# Patient Record
Sex: Female | Born: 1990 | Race: White | Hispanic: No | Marital: Married | Smoking: Never smoker
Health system: Southern US, Community
[De-identification: ages and names within clinical notes are randomized; demographics above are authoritative.]

## PROBLEM LIST (undated history)

## (undated) DIAGNOSIS — K219 Gastro-esophageal reflux disease without esophagitis: Secondary | ICD-10-CM

## (undated) DIAGNOSIS — M1990 Unspecified osteoarthritis, unspecified site: Secondary | ICD-10-CM

## (undated) DIAGNOSIS — R519 Headache, unspecified: Secondary | ICD-10-CM

## (undated) HISTORY — PX: NO PAST SURGERIES: SHX2092

## (undated) HISTORY — DX: Unspecified osteoarthritis, unspecified site: M19.90

## (undated) HISTORY — DX: Headache, unspecified: R51.9

## (undated) HISTORY — DX: Gastro-esophageal reflux disease without esophagitis: K21.9

---

## 2018-01-10 ENCOUNTER — Ambulatory Visit
Admission: EM | Admit: 2018-01-10 | Discharge: 2018-01-10 | Disposition: A | Discharge status: Home / Self Care | Payer: BLUE CROSS/BLUE SHIELD | Attending: Family Medicine | Admitting: Family Medicine

## 2018-01-10 DIAGNOSIS — S76111A Strain of right quadriceps muscle, fascia and tendon, initial encounter: Secondary | ICD-10-CM | POA: Diagnosis not present

## 2018-01-10 DIAGNOSIS — Y936A Activity, physical games generally associated with school recess, summer camp and children: Secondary | ICD-10-CM | POA: Diagnosis not present

## 2018-01-10 MED ORDER — MELOXICAM 15 MG PO TABS: 15.0000 mg | ORAL_TABLET | Freq: Every day | ORAL | 0 refills | Status: DC | PRN

## 2018-01-10 NOTE — ED Triage Notes (Signed)
Pt was playing kickball on Wednesday and her right leg gave out and having pain in her right thigh that isn't getting better. Hurts when she moves it a certain way, pt is limping. Did ice it the first few days and that did decrease the knot.

## 2018-01-10 NOTE — ED Provider Notes (Signed)
[facility]    CSN: [id number] Arrival date & time: 01/10/18  0804  History   Chief Complaint Chief Complaint  Patient presents with  . Leg Pain   HPI  27 year old female presents with the above complaint.  Patient states she was playing kickball on Wednesday.  She slid and alert wants and believes that she injured her right thigh.  She also went to kick the ball subsequently had significant pain of the anterior right thigh.  Patient states that it feels weak.  She states that there is an area of swelling in the anterior thigh.  Has improved with ice.  Patient states that she is able to walk.  Pain is worse with certain activities.  No reports of knee pain.  Reports of hip pain.  No other associated symptoms.  No other complaints.  History reviewed as below.  PMH: No significant PMH.  Surgical Hx: None.  OB History   None    Social History Social History   Tobacco Use  . Smoking status: Never Smoker  . Smokeless tobacco: Never Used  Substance Use Topics  . Alcohol use: Yes  . Drug use: Never     Allergies   Patient has no known allergies.   Review of Systems Review of Systems  Constitutional: Negative.   Musculoskeletal:       Right thigh pain.   Physical Exam Triage Vital Signs ED Triage Vitals  Enc Vitals Group     BP 01/10/18 0817 (!) 141/80     Pulse Rate 01/10/18 0817 64     Resp 01/10/18 0817 18     Temp 01/10/18 0817 98.1 F (36.7 C)     Temp Source 01/10/18 0817 Oral     SpO2 01/10/18 0817 100 %     Weight 01/10/18 0820 219 lb (99.3 kg)     Height --      Head Circumference --      Peak Flow --      Pain Score 01/10/18 0820 5     Pain Loc --      Pain Edu? --      Excl. in GC? --    Updated Vital Signs BP (!) 141/80 (BP Location: Right Arm)   Pulse 64   Temp 98.1 F (36.7 C) (Oral)   Resp 18   Wt 99.3 kg   LMP 01/04/2018 (Exact Date)   SpO2 100%   Visual Acuity Right Eye Distance:   Left Eye Distance:   Bilateral  Distance:    Right Eye Near:   Left Eye Near:    Bilateral Near:     Physical Exam  Constitutional: She is oriented to person, place, and time. She appears well-developed. No distress.  HENT:  Head: Normocephalic and atraumatic.  Eyes: Conjunctivae are normal. Right eye exhibits no discharge. Left eye exhibits no discharge.  Pulmonary/Chest: Effort normal. No respiratory distress.  Musculoskeletal:  Right anterior thigh with mild tenderness. Slightly decreased strength of the quadriceps.  Neurological: She is alert and oriented to person, place, and time.  Psychiatric: She has a normal mood and affect. Her behavior is normal.  Nursing note and vitals reviewed.  UC Treatments / Results  Labs (all labs ordered are listed, but only abnormal results are displayed) Labs Reviewed - No data to display  EKG None  Radiology No results found.  Procedures Procedures (including critical care time)  Medications Ordered in UC Medications - No data to display  Initial Impression /  Assessment and Plan / UC Course  I have reviewed the triage vital signs and the nursing notes.  Pertinent labs & imaging results that were available during my care of the patient were reviewed by me and considered in my medical decision making (see chart for details).    27 year old female presents with strain of the quadriceps.  Advised rest, ice.  Meloxicam as needed.  If fails to improve or worsens, she should see my colleague Dr. Katrinka Blazing (Sports medicine); his information was given.  Final Clinical Impressions(s) / UC Diagnoses   Final diagnoses:  Strain of right quadriceps, initial encounter     Discharge Instructions     Rest, ice, elevation.  Use the medication.  If you are not improving in 1 week, contact Dr. Katrinka Blazing.  Take care  Dr. Adriana Simas    ED Prescriptions    Medication Sig Dispense Auth. Provider   meloxicam (MOBIC) 15 MG tablet Take 1 tablet (15 mg total) by mouth daily as needed.  30 tablet Tommie Sams, DO     Controlled Substance Prescriptions [place] Controlled Substance Registry consulted? Not Applicable   Tommie Sams, [place] 01/10/18 [phone]

## 2018-01-10 NOTE — Discharge Instructions (Signed)
Rest, ice, elevation.  Use the medication.  If you are not improving in 1 week, contact Dr. Katrinka BlazingSmith.  Take care  Dr. Adriana Simasook

## 2019-04-30 NOTE — L&D Delivery Note (Signed)
  Kellie Ross, Kellie Ross [[id number]]  Delivery Note Pt pushed for about an hour and at 2:41 PM a viable female was delivered via Vaginal, Spontaneous (Presentation: Left Occiput Anterior).  APGAR: 9, 10; weight 5 lb 5.4 oz (2420 g).   Loose nuchal x 1 was reduced over infants head. Anterior and posterior shoulders delivered easily next. After a minute the cord was clamped and cut by FOB and infant handed off to the NICU team.   The placenta was left in place but identified with two clamps.       Brielle, Moro [[id number]]  Delivery Note  Next, a bedside ultrasound comfirmed baby B still in vertex position; high station. Under US guidance and with mild fundal pressure applied, AROM was effected with copious clear fluid noted.  Pt pushed for 3-4 sets and at 2:47 PM a viable female was delivered via Vaginal, Spontaneous (Presentation: Right Occiput Anterior).  APGAR: 8, 9; weight 5 lb 11.7 oz (2600 g).   The cord was loosely wrapped around infants arm otherwise no complications.  Cord was similarly clamped and cur after a minute delay by FOB. Infant was handed to the second NICU team standing by. Cord blood was obtained x 2 next. Both placentas were then spontaneously delivered in [place], marked and sent to L/D.  They were both intact and had Cords: 3 vessels with the following complications: None.  Cord pH: n/a  While the second degree perineal laceration was being repaired, a steady stream of bleeding was noted. Fundus was firm except for lower lower uterine segment.  Patients lips were noted to be pale and she reported feeling weak.  Stat cbc was ordered, fluids and pitocin run wide open. A second IV line was placed and albumen administered.  When bleeding continued, tranexamic acid was given.  Once bleeding was estimated to exceed 1023ml, a code hemorrhage was called.  Pt met criteria for a stage 2- orders were implemented.  Fundal height was noted at 6-8GS above umbilicusand to the  patients right.  The bladder was drained of urine.  Bleeding subsequently slowed to a stop and BP which had been cycling q 82mins was noted to stabilized around 120s/70-80s.   Continued to monitor patient for a little longer.  Cbc showed EBL dropped from 12.3 - 7.6  Routine pp procedure then implemented.  Anesthesia: Epidural Episiotomy: None Lacerations: 2nd degree Suture Repair: 2.0 3.0 vicryl Est. Blood Loss (mL): 1982  Mom to postpartum.  Baby to NICU.  Isaiah Serge 01/04/2020, 8:05 PM

## 2019-07-20 LAB — OB RESULTS CONSOLE HIV ANTIBODY (ROUTINE TESTING): HIV: NONREACTIVE

## 2019-07-20 LAB — OB RESULTS CONSOLE RPR: RPR: NONREACTIVE

## 2019-07-20 LAB — OB RESULTS CONSOLE HEPATITIS B SURFACE ANTIGEN: Hepatitis B Surface Ag: NEGATIVE

## 2019-07-20 LAB — OB RESULTS CONSOLE RUBELLA ANTIBODY, IGM: Rubella: IMMUNE

## 2019-09-28 ENCOUNTER — Other Ambulatory Visit: Payer: Self-pay | Admitting: Obstetrics and Gynecology

## 2019-09-28 DIAGNOSIS — O30091 Twin pregnancy, unable to determine number of placenta and number of amniotic sacs, first trimester: Secondary | ICD-10-CM

## 2019-10-13 ENCOUNTER — Encounter: Payer: Self-pay | Admitting: *Deleted

## 2019-10-13 ENCOUNTER — Other Ambulatory Visit: Payer: Self-pay | Admitting: Obstetrics and Gynecology

## 2019-10-13 DIAGNOSIS — O30091 Twin pregnancy, unable to determine number of placenta and number of amniotic sacs, first trimester: Secondary | ICD-10-CM

## 2019-10-15 ENCOUNTER — Ambulatory Visit: Payer: BC Managed Care – PPO | Admitting: *Deleted

## 2019-10-15 ENCOUNTER — Ambulatory Visit: Payer: BC Managed Care – PPO | Attending: Obstetrics and Gynecology

## 2019-10-15 ENCOUNTER — Other Ambulatory Visit: Payer: Self-pay

## 2019-10-15 ENCOUNTER — Ambulatory Visit (HOSPITAL_BASED_OUTPATIENT_CLINIC_OR_DEPARTMENT_OTHER): Payer: BC Managed Care – PPO | Admitting: Obstetrics and Gynecology

## 2019-10-15 VITALS — BP 134/71 | HR 113 | Ht 70.5 in

## 2019-10-15 DIAGNOSIS — O99212 Obesity complicating pregnancy, second trimester: Secondary | ICD-10-CM | POA: Diagnosis present

## 2019-10-15 DIAGNOSIS — E668 Other obesity: Secondary | ICD-10-CM

## 2019-10-15 DIAGNOSIS — O0990 Supervision of high risk pregnancy, unspecified, unspecified trimester: Secondary | ICD-10-CM | POA: Insufficient documentation

## 2019-10-15 DIAGNOSIS — Z363 Encounter for antenatal screening for malformations: Secondary | ICD-10-CM

## 2019-10-15 DIAGNOSIS — O30042 Twin pregnancy, dichorionic/diamniotic, second trimester: Secondary | ICD-10-CM

## 2019-10-15 DIAGNOSIS — E669 Obesity, unspecified: Secondary | ICD-10-CM | POA: Diagnosis not present

## 2019-10-15 DIAGNOSIS — O30091 Twin pregnancy, unable to determine number of placenta and number of amniotic sacs, first trimester: Secondary | ICD-10-CM | POA: Diagnosis not present

## 2019-10-15 DIAGNOSIS — Z3A23 23 weeks gestation of pregnancy: Secondary | ICD-10-CM

## 2019-10-15 NOTE — Progress Notes (Signed)
Maternal-Fetal Medicine  Name: Kellie Ross MRN: [id number] Referring Provider: Dr. Sherron Monday  Ms. [place], G1 P0 at 23w 2d gestation with dichorionic-diamniotic twin pregnancy, is here for fetal anatomy scan and consultation.  Topical survey was inadequate at your office.  Patient had opted not to screen for fetal aneuploidies. This is a natural conception. Past medical history: No history of diabetes or hypertension or any chronic medical conditions. Past surgical history: Nil of note. Medications: Prenatal vitamins. Allergies: No known drug allergies. Social history: Denies tobacco, drug or alcohol use.  She has been married for years and her husband is in good health. Family history: Father has hypertension and mother has diabetes.  Sister had cervical incompetence and a mid-trimester pregnancy loss.  No history of venous thromboembolism in the family. On ultrasound performed at your office, the cervix measured 3.7 cm.  On today's ultrasound, we confirmed dichorionic-diamniotic twin pregnancy. Twin A: Lower fetus, cephalic presentation, posterior placenta, female fetus.  The estimated fetal weight is at the 96th percentile.  Amniotic fluid is normal and good fetal activity seen.  No markers of aneuploidies or fetal structural defects are seen.  Fetal anatomical survey was incomplete because of maternal body habitus and poor resolution of images.  Twin B: Upper fetus, transverse lie and head to maternal left, anterior placenta, female fetus. The estimated fetal weight is at the 97th percentile.  Amniotic fluid is normal and good fetal activity seen.  No markers of aneuploidies or fetal structural defects are seen.  Fetal anatomical survey was incomplete because of maternal body habitus and poor resolution of images. Growth discordancy: 2% (normal). Patient is 5 feet 11 inches tall and her husband is 6 tall.  Our concerns include: Twin pregnancy:  I explained the significance of  chorionicity and its implications. Possible complications associated with twin pregnancy include preterm labor/delivery (most common), fetal growth restriction of one or both twins, miscarriage, malpresentations and increased cesarean delivery rate, postpartum hemorrhage. Maternal complications including gestational diabetes and gestational hypertension/preeclampsia are more common. I discussed the mode of delivery that is based on the presentations.  If both have Vx/Vx or Vx/non-vertex presentations, vaginal delivery may be considered. In Vx/non-vx, vaginal delivery followed by internal podalic version of second twin will achieve vaginal delivery. In non-vx first twin presentation, cesarean delivery will be performed.  I emphasized the importance of weight gain (24 lbs by 24 weeks) to improve fetal weight and decrease the chances of preterm delivery.  Low-dose aspirin is beneficial in preventing preeclampsia. Based on systematic reviews, the [place] Preventive Services Task Force (USPSTF) recommended low-dose aspirin in pregnancies that are at high-risk of developing preeclampsia [facility] [phone]). Twin pregnancy is at high risk for preeclampsia. I recommended aspirin (81 mg daily) to be started from now until delivery. She does not have contraindications to aspirin.   -An appointment was made for her to return in 4 weeks for completion of fetal anatomy if feasible. -Follow-up fetal growth assessment may be performed at your office. -Serial fetal growth assessments every 4 weeks. -Weekly antenatal testing at 36 and 37 weeks. -Delivery at 38 weeks.  -Aspirin 81 mg daily until delivery.  Thank you for consultation. Please contact me at the [facility] if you have any questions. Consultation including face-to-face counseling: 45 minutes.

## 2019-10-20 ENCOUNTER — Other Ambulatory Visit: Payer: Self-pay | Admitting: *Deleted

## 2019-10-20 DIAGNOSIS — O30049 Twin pregnancy, dichorionic/diamniotic, unspecified trimester: Secondary | ICD-10-CM

## 2019-11-15 ENCOUNTER — Other Ambulatory Visit: Payer: Self-pay

## 2019-11-15 ENCOUNTER — Encounter (INDEPENDENT_AMBULATORY_CARE_PROVIDER_SITE_OTHER): Payer: Self-pay

## 2019-11-15 ENCOUNTER — Ambulatory Visit: Payer: Self-pay | Attending: Obstetrics and Gynecology

## 2019-11-15 ENCOUNTER — Other Ambulatory Visit: Payer: Self-pay | Admitting: *Deleted

## 2019-11-15 ENCOUNTER — Ambulatory Visit: Payer: Self-pay | Admitting: *Deleted

## 2019-11-15 VITALS — BP 142/82 | HR 108

## 2019-11-15 DIAGNOSIS — O30042 Twin pregnancy, dichorionic/diamniotic, second trimester: Secondary | ICD-10-CM

## 2019-11-15 DIAGNOSIS — E669 Obesity, unspecified: Secondary | ICD-10-CM

## 2019-11-15 DIAGNOSIS — O99212 Obesity complicating pregnancy, second trimester: Secondary | ICD-10-CM

## 2019-11-15 DIAGNOSIS — O0990 Supervision of high risk pregnancy, unspecified, unspecified trimester: Secondary | ICD-10-CM

## 2019-11-15 DIAGNOSIS — Z362 Encounter for other antenatal screening follow-up: Secondary | ICD-10-CM

## 2019-11-15 DIAGNOSIS — O30043 Twin pregnancy, dichorionic/diamniotic, third trimester: Secondary | ICD-10-CM

## 2019-11-15 DIAGNOSIS — Z3A27 27 weeks gestation of pregnancy: Secondary | ICD-10-CM

## 2019-11-15 DIAGNOSIS — O9A212 Injury, poisoning and certain other consequences of external causes complicating pregnancy, second trimester: Secondary | ICD-10-CM

## 2019-11-15 DIAGNOSIS — T1490XA Injury, unspecified, initial encounter: Secondary | ICD-10-CM

## 2019-11-15 DIAGNOSIS — O30049 Twin pregnancy, dichorionic/diamniotic, unspecified trimester: Secondary | ICD-10-CM | POA: Insufficient documentation

## 2019-12-13 ENCOUNTER — Encounter: Payer: Self-pay | Admitting: *Deleted

## 2019-12-13 ENCOUNTER — Ambulatory Visit: Payer: BC Managed Care – PPO | Attending: Obstetrics and Gynecology

## 2019-12-13 ENCOUNTER — Other Ambulatory Visit: Payer: Self-pay | Admitting: *Deleted

## 2019-12-13 ENCOUNTER — Other Ambulatory Visit: Payer: Self-pay

## 2019-12-13 ENCOUNTER — Ambulatory Visit: Payer: BC Managed Care – PPO | Admitting: *Deleted

## 2019-12-13 VITALS — BP 142/93 | HR 119

## 2019-12-13 DIAGNOSIS — O30043 Twin pregnancy, dichorionic/diamniotic, third trimester: Secondary | ICD-10-CM

## 2019-12-13 DIAGNOSIS — O9A213 Injury, poisoning and certain other consequences of external causes complicating pregnancy, third trimester: Secondary | ICD-10-CM | POA: Diagnosis not present

## 2019-12-13 DIAGNOSIS — Z362 Encounter for other antenatal screening follow-up: Secondary | ICD-10-CM | POA: Diagnosis not present

## 2019-12-13 DIAGNOSIS — T1490XA Injury, unspecified, initial encounter: Secondary | ICD-10-CM | POA: Diagnosis not present

## 2019-12-13 DIAGNOSIS — O99213 Obesity complicating pregnancy, third trimester: Secondary | ICD-10-CM

## 2019-12-13 DIAGNOSIS — O30049 Twin pregnancy, dichorionic/diamniotic, unspecified trimester: Secondary | ICD-10-CM

## 2019-12-13 DIAGNOSIS — E669 Obesity, unspecified: Secondary | ICD-10-CM

## 2019-12-13 DIAGNOSIS — Z3A31 31 weeks gestation of pregnancy: Secondary | ICD-10-CM

## 2019-12-29 ENCOUNTER — Encounter (HOSPITAL_COMMUNITY): Payer: Self-pay | Admitting: Obstetrics and Gynecology

## 2019-12-29 ENCOUNTER — Other Ambulatory Visit: Payer: Self-pay

## 2019-12-29 ENCOUNTER — Inpatient Hospital Stay (HOSPITAL_COMMUNITY)
Admission: AD | Admit: 2019-12-29 | Discharge: 2020-01-07 | DRG: 806 | Disposition: A | Discharge status: Home / Self Care | Payer: BC Managed Care – PPO | Attending: Obstetrics and Gynecology | Admitting: Obstetrics and Gynecology

## 2019-12-29 ENCOUNTER — Inpatient Hospital Stay (HOSPITAL_BASED_OUTPATIENT_CLINIC_OR_DEPARTMENT_OTHER): Payer: BC Managed Care – PPO

## 2019-12-29 DIAGNOSIS — D62 Acute posthemorrhagic anemia: Secondary | ICD-10-CM | POA: Diagnosis not present

## 2019-12-29 DIAGNOSIS — Z3A34 34 weeks gestation of pregnancy: Secondary | ICD-10-CM

## 2019-12-29 DIAGNOSIS — O9962 Diseases of the digestive system complicating childbirth: Secondary | ICD-10-CM | POA: Diagnosis present

## 2019-12-29 DIAGNOSIS — O99892 Other specified diseases and conditions complicating childbirth: Secondary | ICD-10-CM | POA: Diagnosis present

## 2019-12-29 DIAGNOSIS — O1493 Unspecified pre-eclampsia, third trimester: Secondary | ICD-10-CM | POA: Diagnosis present

## 2019-12-29 DIAGNOSIS — O1413 Severe pre-eclampsia, third trimester: Secondary | ICD-10-CM

## 2019-12-29 DIAGNOSIS — Z3689 Encounter for other specified antenatal screening: Secondary | ICD-10-CM

## 2019-12-29 DIAGNOSIS — N649 Disorder of breast, unspecified: Secondary | ICD-10-CM | POA: Diagnosis present

## 2019-12-29 DIAGNOSIS — K219 Gastro-esophageal reflux disease without esophagitis: Secondary | ICD-10-CM | POA: Diagnosis present

## 2019-12-29 DIAGNOSIS — O1414 Severe pre-eclampsia complicating childbirth: Secondary | ICD-10-CM | POA: Diagnosis present

## 2019-12-29 DIAGNOSIS — O139 Gestational [pregnancy-induced] hypertension without significant proteinuria, unspecified trimester: Secondary | ICD-10-CM | POA: Diagnosis not present

## 2019-12-29 DIAGNOSIS — O30043 Twin pregnancy, dichorionic/diamniotic, third trimester: Secondary | ICD-10-CM

## 2019-12-29 DIAGNOSIS — O1494 Unspecified pre-eclampsia, complicating childbirth: Secondary | ICD-10-CM | POA: Diagnosis present

## 2019-12-29 DIAGNOSIS — O30049 Twin pregnancy, dichorionic/diamniotic, unspecified trimester: Secondary | ICD-10-CM | POA: Diagnosis present

## 2019-12-29 DIAGNOSIS — O9081 Anemia of the puerperium: Secondary | ICD-10-CM | POA: Diagnosis not present

## 2019-12-29 DIAGNOSIS — O30009 Twin pregnancy, unspecified number of placenta and unspecified number of amniotic sacs, unspecified trimester: Secondary | ICD-10-CM

## 2019-12-29 DIAGNOSIS — O1205 Gestational edema, complicating the puerperium: Secondary | ICD-10-CM | POA: Diagnosis not present

## 2019-12-29 DIAGNOSIS — Z20822 Contact with and (suspected) exposure to covid-19: Secondary | ICD-10-CM | POA: Diagnosis present

## 2019-12-29 LAB — CBC
HCT: 38.9 % (ref 36.0–46.0)
Hemoglobin: 12.8 g/dL (ref 12.0–15.0)
MCH: 30.2 pg (ref 26.0–34.0)
MCHC: 32.9 g/dL (ref 30.0–36.0)
MCV: 91.7 fL (ref 80.0–100.0)
Platelets: 212 10*3/uL (ref 150–400)
RBC: 4.24 MIL/uL (ref 3.87–5.11)
RDW: 18.6 % — ABNORMAL HIGH (ref 11.5–15.5)
WBC: 9.5 10*3/uL (ref 4.0–10.5)
nRBC: 0 % (ref 0.0–0.2)

## 2019-12-29 LAB — COMPREHENSIVE METABOLIC PANEL
ALT: 16 U/L (ref 0–44)
AST: 32 U/L (ref 15–41)
Albumin: 2.4 g/dL — ABNORMAL LOW (ref 3.5–5.0)
Alkaline Phosphatase: 194 U/L — ABNORMAL HIGH (ref 38–126)
Anion gap: 10 (ref 5–15)
BUN: 11 mg/dL (ref 6–20)
CO2: 23 mmol/L (ref 22–32)
Calcium: 8.9 mg/dL (ref 8.9–10.3)
Chloride: 104 mmol/L (ref 98–111)
Creatinine, Ser: 0.95 mg/dL (ref 0.44–1.00)
GFR calc Af Amer: >60 mL/min (ref >60)
GFR calc non Af Amer: >60 mL/min (ref >60)
Glucose, Bld: 96 mg/dL (ref 70–99)
Potassium: 4 mmol/L (ref 3.5–5.1)
Sodium: 137 mmol/L (ref 135–145)
Total Bilirubin: 0.5 mg/dL (ref 0.3–1.2)
Total Protein: 5.7 g/dL — ABNORMAL LOW (ref 6.5–8.1)

## 2019-12-29 LAB — TYPE AND SCREEN
ABO/RH(D): B POS
Antibody Screen: NEGATIVE

## 2019-12-29 LAB — PROTEIN / CREATININE RATIO, URINE
Creatinine, Urine: 481.93 mg/dL
Protein Creatinine Ratio: 1.91 mg/mg{Cre} — ABNORMAL HIGH (ref 0.00–0.15)
Total Protein, Urine: 920 mg/dL

## 2019-12-29 LAB — SARS CORONAVIRUS 2 BY RT PCR (HOSPITAL ORDER, PERFORMED IN ~~LOC~~ HOSPITAL LAB): SARS Coronavirus 2: NEGATIVE

## 2019-12-29 LAB — GROUP B STREP BY PCR: Group B strep by PCR: NEGATIVE

## 2019-12-29 MED ORDER — LABETALOL HCL 5 MG/ML IV SOLN: 40.0000 mg | INTRAVENOUS | Status: DC | PRN

## 2019-12-29 MED ORDER — HYDRALAZINE HCL 20 MG/ML IJ SOLN: 10.0000 mg | INTRAMUSCULAR | Status: DC | PRN

## 2019-12-29 MED ORDER — LABETALOL HCL 5 MG/ML IV SOLN
20.0000 mg | INTRAVENOUS | Status: DC | PRN
  Administered 2020-01-03: 20 mg via INTRAVENOUS
  Filled 2019-12-29: qty 4

## 2019-12-29 MED ORDER — LABETALOL HCL 5 MG/ML IV SOLN: 80.0000 mg | INTRAVENOUS | Status: DC | PRN

## 2019-12-29 MED ORDER — ZOLPIDEM TARTRATE 5 MG PO TABS: 5.0000 mg | ORAL_TABLET | Freq: Every evening | ORAL | Status: DC | PRN

## 2019-12-29 MED ORDER — BETAMETHASONE SOD PHOS & ACET 6 (3-3) MG/ML IJ SUSP
12.0000 mg | INTRAMUSCULAR | Status: AC
  Administered 2019-12-29 – 2019-12-30 (×2): 12 mg via INTRAMUSCULAR
  Filled 2019-12-29: qty 5

## 2019-12-29 MED ORDER — LABETALOL HCL 5 MG/ML IV SOLN
20.0000 mg | INTRAVENOUS | Status: DC | PRN
  Administered 2019-12-29: 20 mg via INTRAVENOUS
  Filled 2019-12-29: qty 4

## 2019-12-29 MED ORDER — ACETAMINOPHEN 325 MG PO TABS
650.0000 mg | ORAL_TABLET | ORAL | Status: DC | PRN
  Administered 2019-12-30 (×2): 650 mg via ORAL
  Filled 2019-12-29 (×2): qty 2

## 2019-12-29 MED ORDER — FAMOTIDINE 20 MG PO TABS
20.0000 mg | ORAL_TABLET | Freq: Every day | ORAL | Status: DC
  Administered 2019-12-29 – 2020-01-03 (×6): 20 mg via ORAL
  Filled 2019-12-29 (×8): qty 1

## 2019-12-29 MED ORDER — POLYSACCHARIDE IRON COMPLEX 150 MG PO CAPS
150.0000 mg | ORAL_CAPSULE | Freq: Every day | ORAL | Status: DC
  Administered 2019-12-30 – 2020-01-03 (×5): 150 mg via ORAL
  Filled 2019-12-29 (×5): qty 1

## 2019-12-29 MED ORDER — LABETALOL HCL 200 MG PO TABS
200.0000 mg | ORAL_TABLET | Freq: Two times a day (BID) | ORAL | Status: DC
  Administered 2019-12-29 – 2020-01-02 (×8): 200 mg via ORAL
  Filled 2019-12-29 (×8): qty 1

## 2019-12-29 MED ORDER — DOCUSATE SODIUM 100 MG PO CAPS
100.0000 mg | ORAL_CAPSULE | Freq: Every day | ORAL | Status: DC
  Administered 2019-12-30 – 2020-01-03 (×5): 100 mg via ORAL
  Filled 2019-12-29 (×5): qty 1

## 2019-12-29 MED ORDER — SODIUM CHLORIDE 0.9% FLUSH
3.0000 mL | Freq: Two times a day (BID) | INTRAVENOUS | Status: DC
  Administered 2019-12-30 – 2020-01-03 (×6): 3 mL via INTRAVENOUS

## 2019-12-29 MED ORDER — PRENATAL MULTIVITAMIN CH
1.0000 | ORAL_TABLET | Freq: Every day | ORAL | Status: DC
  Administered 2019-12-30 – 2020-01-03 (×5): 1 via ORAL
  Filled 2019-12-29 (×5): qty 1

## 2019-12-29 MED ORDER — CALCIUM CARBONATE ANTACID 500 MG PO CHEW: 2.0000 | CHEWABLE_TABLET | ORAL | Status: DC | PRN

## 2019-12-29 MED ORDER — SODIUM CHLORIDE 0.9 % IV SOLN: 250.0000 mL | INTRAVENOUS | Status: DC | PRN

## 2019-12-29 MED ORDER — SODIUM CHLORIDE 0.9% FLUSH: 3.0000 mL | INTRAVENOUS | Status: DC | PRN

## 2019-12-29 NOTE — MAU Note (Signed)
Sent from MD office for BP evaluation.  Denies visual disturbances, H/A, and epigastric pain.  Reports +FM x2. Denies LOF or VB.

## 2019-12-29 NOTE — H&P (Signed)
Kellie Ross is a 29 y.o. female, G1P0, EGA [redacted]W[redacted]D with di/di twins presenting for BP eval.  She was seen for routine OB visit today, not feeling very well, BP in the office 140-150/100, BP 2 weeks ago was 122/76.  On eval in MAU, BP up to 160/100 requiring IV Labetalol, labs normal except for UPC 1.91.  Prenatal care complicated by di/di twins with appropriate growth, anemia treated with iron, GERD treated with Pepcid.  OB History    Gravida  1   Para      Term      Preterm      AB      Living  0     SAB      TAB      Ectopic      Multiple      Live Births             Past Medical History:  Diagnosis Date  . Arthritis   . GERD (gastroesophageal reflux disease)   . Headache    Past Surgical History:  Procedure Laterality Date  . NO PAST SURGERIES     Family History: family history is not on file. Social History:  reports that she has never smoked. She has never used smokeless tobacco. She reports previous alcohol use. She reports that she does not use drugs.     Maternal Diabetes: No Genetic Screening: Declined Maternal Ultrasounds/Referrals: Normal Fetal Ultrasounds or other Referrals:  None Maternal Substance Abuse:  No Significant Maternal Medications:  None Significant Maternal Lab Results:  None Other Comments:  preeclampsia at 34 weeks, di/di twins  Review of Systems  Respiratory: Negative.   Cardiovascular: Negative.    Maternal Medical History:  Fetal activity: Perceived fetal activity is normal.    Prenatal Complications - Diabetes: none.      Blood pressure 130/82, pulse 90, temperature 98 F (36.7 C), temperature source Oral, resp. rate 18, height 5\' 11"  (1.803 m), weight 129.3 kg, SpO2 99 %. Maternal Exam:  Abdomen: Patient reports no abdominal tenderness. Introitus: Amniotic fluid character: not assessed.     Fetal Exam Fetal Monitor Review: Mode: ultrasound.   Variability: moderate (6-25 bpm).   Pattern: accelerations present  and no decelerations.    Fetal State Assessment: Category I - tracings are normal.     Physical Exam Vitals reviewed.  Constitutional:      Appearance: Normal appearance.  Cardiovascular:     Rate and Rhythm: Normal rate and regular rhythm.  Pulmonary:     Effort: Pulmonary effort is normal. No respiratory distress.  Neurological:     Mental Status: She is alert.     Prenatal labs: ABO, Rh: --/--/B POS (09/01 1424) Antibody: NEG (09/01 1424) Rubella:  Immune RPR:   NR HBsAg:   neg HIV:   NR GBS:   none  Assessment/Plan: IUP at [redacted]W[redacted]D, di/di twins that are vtx/vtx by u/s today with nl AFV, with preeclampsia.  Had some severe range BP requiring IV Labetalol, started on Labetalol 200 mg po bid, BP now 130/90.  I have discussed that if BP goes up persistently or labs become abnormal will need to move towards delivery and start magnesium for seizure prophylaxis.  Getting betamethasone for fetal pulmonary maturation.  Will admit, monitor BP closely, recheck labs in am, low threshold for starting magnesium and moving towards delivery.   09-25-1971 Kellie Ross 12/29/2019, 5:34 PM

## 2019-12-29 NOTE — MAU Provider Note (Signed)
Chief Complaint:  Hypertension   None    HPI: Kellie Ross is a 29 y.o. G1P0 at [redacted]w[redacted]d with di/di twins who presents to maternity admissions reporting hypertension. Was seen in the office today & had elevated blood pressures; sent here for further evaluation. States a few weeks ago she had high BP at her MFM appointment but otherwise has no history of hypertension.  Denies headache, visual disturbance, or epigastric pain. Denies contractions, LOF, or vaginal bleeding. Good fetal movement x 2.   Pregnancy Course: [facility] ob/gyn. Di/di twins  Past Medical History:  Diagnosis Date  . Arthritis   . GERD (gastroesophageal reflux disease)   . Headache    OB History  Gravida Para Term Preterm AB Living  1         0  SAB TAB Ectopic Multiple Live Births               # Outcome Date GA Lbr Len/2nd Weight Sex Delivery Anes PTL Lv  1 Current            Past Surgical History:  Procedure Laterality Date  . NO PAST SURGERIES     History reviewed. No pertinent family history. Social History   Tobacco Use  . Smoking status: Never Smoker  . Smokeless tobacco: Never Used  Vaping Use  . Vaping Use: Never used  Substance Use Topics  . Alcohol use: Not Currently  . Drug use: Never   No Known Allergies Medications Prior to Admission  Medication Sig Dispense Refill Last Dose  . Prenatal Vit-Fe Fumarate-FA (PRENATAL VITAMIN PO) Take by mouth.   Past Month at Unknown time  . FEROSUL 325 (65 Fe) MG tablet Take 325 mg by mouth 2 (two) times daily.     . STOOL SOFTENER 100 MG capsule Take 100 mg by mouth 2 (two) times daily.     . TRI-SPRINTEC 0.18/0.215/0.25 MG-35 MCG tablet TK 1 T PO QD (Patient not taking: Reported on 10/15/2019)  10     I have reviewed patient's Past Medical Hx, Surgical Hx, Family Hx, Social Hx, medications and allergies.   ROS:  Review of Systems  Constitutional: Negative.   Cardiovascular: Positive for leg swelling.  Gastrointestinal: Negative.   Genitourinary:  Negative.     Physical Exam   Patient Vitals for the past 24 hrs:  BP Temp Pulse Resp SpO2 Height Weight  12/29/19 1445 (!) 144/84 -- 82 -- -- -- --  12/29/19 1439 134/81 -- 85 -- -- -- --  12/29/19 1400 (!) 166/98 -- 95 -- -- -- --  12/29/19 1345 (!) 152/99 -- 94 -- -- -- --  12/29/19 1330 (!) 153/98 -- 91 -- -- -- --  12/29/19 1315 (!) 154/91 -- 84 -- -- -- --  12/29/19 1300 (!) 152/99 -- 87 -- -- -- --  12/29/19 1245 (!) 147/104 -- 98 -- -- -- --  12/29/19 1230 (!) 167/101 -- 85 -- 99 % -- --  12/29/19 1215 (!) 153/103 -- 91 -- 100 % -- --  12/29/19 1200 (!) 146/103 -- 91 -- 98 % -- --  12/29/19 1145 (!) 154/99 -- 92 -- 98 % -- --  12/29/19 1130 (!) 154/106 -- 96 -- 98 % -- --  12/29/19 1115 (!) 146/105 -- (!) 101 -- 98 % -- --  12/29/19 1102 (!) 156/98 -- -- -- -- -- --  12/29/19 1059 -- 98 F (36.7 C) (!) 101 18 97 % -- --  12/29/19 1045 -- -- -- -- --  5\' 11"  (1.803 m) 129.7 kg    Constitutional: Well-developed, well-nourished female in no acute distress.  Cardiovascular: normal rate & rhythm, no murmur Respiratory: normal effort, lung sounds clear throughout GI: Abd soft, non-tender, gravid appropriate for gestational age. Pos BS x 4 MS: Extremities nontender, 3+ pitting edema BLE, normal ROM Neurologic: Alert and oriented x 4. DTR 2 +, no clonus  Fetal Tracing: Baby A Baseline: 155 Variability: moderate Accelerations: 15x15 Decelerations: none  Baby B Baseline: 150 Variability: moderate Accelerations:15x15 Decelerations: none  Toco: irregular ctx       Labs: Results for orders placed or performed during the hospital encounter of 12/29/19 (from the past 24 hour(s))  CBC     Status: Abnormal   Collection Time: 12/29/19 11:03 AM  Result Value Ref Range   WBC 9.5 4.0 - 10.5 K/uL   RBC 4.24 3.87 - 5.11 MIL/uL   Hemoglobin 12.8 12.0 - 15.0 g/dL   HCT 02/28/20 36 - 46 %   MCV 91.7 80.0 - 100.0 fL   MCH 30.2 26.0 - 34.0 pg   MCHC 32.9 30.0 - 36.0 g/dL   RDW  43.1 (H) 54.0 - 15.5 %   Platelets 212 150 - 400 K/uL   nRBC 0.0 0.0 - 0.2 %  Comprehensive metabolic panel     Status: Abnormal   Collection Time: 12/29/19 11:03 AM  Result Value Ref Range   Sodium 137 135 - 145 mmol/L   Potassium 4.0 3.5 - 5.1 mmol/L   Chloride 104 98 - 111 mmol/L   CO2 23 22 - 32 mmol/L   Glucose, Bld 96 70 - 99 mg/dL   BUN 11 6 - 20 mg/dL   Creatinine, Ser 02/28/20 0.44 - 1.00 mg/dL   Calcium 8.9 8.9 - 7.61 mg/dL   Total Protein 5.7 (L) 6.5 - 8.1 g/dL   Albumin 2.4 (L) 3.5 - 5.0 g/dL   AST 32 15 - 41 U/L   ALT 16 0 - 44 U/L   Alkaline Phosphatase 194 (H) 38 - 126 U/L   Total Bilirubin 0.5 0.3 - 1.2 mg/dL   GFR calc non Af Amer >60 >60 mL/min   GFR calc Af Amer >60 >60 mL/min   Anion gap 10 5 - 15  Protein / creatinine ratio, urine     Status: Abnormal   Collection Time: 12/29/19 12:36 PM  Result Value Ref Range   Creatinine, Urine 481.93 mg/dL   Total Protein, Urine 920 mg/dL   Protein Creatinine Ratio 1.91 (H) 0.00 - 0.15 mg/mg[Cre]    Imaging:  No results found.  MAU Course: Orders Placed This Encounter  Procedures  . CBC  . Comprehensive metabolic panel  . Protein / creatinine ratio, urine  . Vital signs  . Notify Physician  . Fetal monitoring  . Continuous tocometry  . Measure blood pressure  . Vital signs  . Defer vaginal exam for vaginal bleeding or PROM <37 weeks  . Initiate Oral Care Protocol  . Initiate Carrier Fluid Protocol  . Full code  . Type and screen [facility]   Meds ordered this encounter  Medications  . AND Linked Order Group   . labetalol (NORMODYNE) injection 20 mg   . labetalol (NORMODYNE) injection 40 mg   . labetalol (NORMODYNE) injection 80 mg   . hydrALAZINE (APRESOLINE) injection 10 mg  . acetaminophen (TYLENOL) tablet 650 mg  . zolpidem (AMBIEN) tablet 5 mg  . docusate sodium (COLACE) capsule 100 mg  MDM: Elevated BPs. 2 severe range BPs. IV labetalol 20 mg given with good response.   Blood work is normal but protein creatinine ratio is elevated.  Discussed patient with Dr. Jackelyn Knife. Will place order for admission  Patient & her spouse notified of plan and are agreeable.   Assessment: 1. Severe preeclampsia, third trimester   2. [redacted] weeks gestation of pregnancy     Plan: Admit to [facility] unit  Judeth Horn, NP 12/29/2019 3:08 PM

## 2019-12-30 LAB — CBC WITH DIFFERENTIAL/PLATELET
Abs Immature Granulocytes: 0.09 10*3/uL — ABNORMAL HIGH (ref 0.00–0.07)
Basophils Absolute: 0 10*3/uL (ref 0.0–0.1)
Basophils Relative: 0 %
Eosinophils Absolute: 0 10*3/uL (ref 0.0–0.5)
Eosinophils Relative: 0 %
HCT: 37.5 % (ref 36.0–46.0)
Hemoglobin: 12.2 g/dL (ref 12.0–15.0)
Immature Granulocytes: 1 %
Lymphocytes Relative: 25 %
Lymphs Abs: 2.5 10*3/uL (ref 0.7–4.0)
MCH: 30 pg (ref 26.0–34.0)
MCHC: 32.5 g/dL (ref 30.0–36.0)
MCV: 92.4 fL (ref 80.0–100.0)
Monocytes Absolute: 0.3 10*3/uL (ref 0.1–1.0)
Monocytes Relative: 3 %
Neutro Abs: 7.1 10*3/uL (ref 1.7–7.7)
Neutrophils Relative %: 71 %
Platelets: 187 10*3/uL (ref 150–400)
RBC: 4.06 MIL/uL (ref 3.87–5.11)
RDW: 18.6 % — ABNORMAL HIGH (ref 11.5–15.5)
WBC: 9.9 10*3/uL (ref 4.0–10.5)
nRBC: 0 % (ref 0.0–0.2)

## 2019-12-30 LAB — COMPREHENSIVE METABOLIC PANEL
ALT: 16 U/L (ref 0–44)
AST: 30 U/L (ref 15–41)
Albumin: 2.2 g/dL — ABNORMAL LOW (ref 3.5–5.0)
Alkaline Phosphatase: 192 U/L — ABNORMAL HIGH (ref 38–126)
Anion gap: 13 (ref 5–15)
BUN: 12 mg/dL (ref 6–20)
CO2: 19 mmol/L — ABNORMAL LOW (ref 22–32)
Calcium: 9.1 mg/dL (ref 8.9–10.3)
Chloride: 104 mmol/L (ref 98–111)
Creatinine, Ser: 1.02 mg/dL — ABNORMAL HIGH (ref 0.44–1.00)
GFR calc Af Amer: >60 mL/min (ref >60)
GFR calc non Af Amer: >60 mL/min (ref >60)
Glucose, Bld: 108 mg/dL — ABNORMAL HIGH (ref 70–99)
Potassium: 4.2 mmol/L (ref 3.5–5.1)
Sodium: 136 mmol/L (ref 135–145)
Total Bilirubin: 0.5 mg/dL (ref 0.3–1.2)
Total Protein: 5.5 g/dL — ABNORMAL LOW (ref 6.5–8.1)

## 2019-12-30 NOTE — Consult Note (Signed)
Reason for Consult: breast lesion  Referring Physician: Dr. Earmon Phoenix Kellie Ross is an 29 y.o. female.  HPI: Patient has a ~ 1 cm pedunculated erythematous friable lesion on the medial aspect of the right breast. Reports she noticed it in early summer and it was small; she picked at it and a scab formed which resulted in her picking at it more. Reports it continued to increase in size to what she has today. States it is smashed between her breasts when she sleep on her side at night and in the morning looks more like a dangling skin tag until it begins refilling with fluid.    Past Medical History:  Diagnosis Date  . Arthritis   . GERD (gastroesophageal reflux disease)   . Headache     Past Surgical History:  Procedure Laterality Date  . NO PAST SURGERIES      History reviewed. No pertinent family history.  Social History:  reports that she has never smoked. She has never used smokeless tobacco. She reports previous alcohol use. She reports that she does not use drugs.  Allergies: No Known Allergies  Medications: I have reviewed the patient's current medications.  Results for orders placed or performed during the hospital encounter of 12/29/19 (from the past 48 hour(s))  CBC     Status: Abnormal   Collection Time: 12/29/19 11:03 AM  Result Value Ref Range   WBC 9.5 4.0 - 10.5 K/uL   RBC 4.24 3.87 - 5.11 MIL/uL   Hemoglobin 12.8 12.0 - 15.0 g/dL   HCT 96.2 36 - 46 %   MCV 91.7 80.0 - 100.0 fL   MCH 30.2 26.0 - 34.0 pg   MCHC 32.9 30.0 - 36.0 g/dL   RDW 95.2 (H) 84.1 - 32.4 %   Platelets 212 150 - 400 K/uL   nRBC 0.0 0.0 - 0.2 %    Comment: Performed at [facility], [address] [place], [place] [address]  Comprehensive metabolic panel     Status: Abnormal   Collection Time: 12/29/19 11:03 AM  Result Value Ref Range   Sodium 137 135 - 145 mmol/L   Potassium 4.0 3.5 - 5.1 mmol/L   Chloride 104 98 - 111 mmol/L   CO2 23 22 - 32 mmol/L   Glucose, Bld 96  70 - 99 mg/dL    Comment: Glucose reference range applies only to samples taken after fasting for at least 8 hours.   BUN 11 6 - 20 mg/dL   Creatinine, Ser 7.25 0.44 - 1.00 mg/dL   Calcium 8.9 8.9 - 36.6 mg/dL   Total Protein 5.7 (L) 6.5 - 8.1 g/dL   Albumin 2.4 (L) 3.5 - 5.0 g/dL   AST 32 15 - 41 U/L   ALT 16 0 - 44 U/L   Alkaline Phosphatase 194 (H) 38 - 126 U/L   Total Bilirubin 0.5 0.3 - 1.2 mg/dL   GFR calc non Af Amer >60 >60 mL/min   GFR calc Af Amer >60 >60 mL/min   Anion gap 10 5 - 15    Comment: Performed at [facility], [address], [place], [place] [address]  Protein / creatinine ratio, urine     Status: Abnormal   Collection Time: 12/29/19 12:36 PM  Result Value Ref Range   Creatinine, Urine 481.93 mg/dL    Comment: RESULTS CONFIRMED BY MANUAL DILUTION   Total Protein, Urine 920 mg/dL    Comment: NO NORMAL RANGE ESTABLISHED FOR THIS  TEST RESULTS CONFIRMED BY MANUAL DILUTION    Protein Creatinine Ratio 1.91 (H) 0.00 - 0.15 mg/mg[Cre]    Comment: Performed at [facility], [address] [place], [place] [address]  Type and screen [facility]     Status: None   Collection Time: 12/29/19  2:24 PM  Result Value Ref Range   ABO/RH(D) B POS    Antibody Screen NEG    Sample Expiration      01/01/2020,2359 Performed at [facility], [address] [place], [place] [address]   SARS Coronavirus 2 by RT PCR (hospital order, performed in [facility] hospital lab) Nasopharyngeal Nasopharyngeal Swab     Status: None   Collection Time: 12/29/19  3:11 PM   Specimen: Nasopharyngeal Swab  Result Value Ref Range   SARS Coronavirus 2 NEGATIVE NEGATIVE    Comment: (NOTE) SARS-CoV-2 target nucleic acids are NOT DETECTED.  The SARS-CoV-2 RNA is generally detectable in upper and lower respiratory specimens during the acute phase of infection. The lowest concentration of SARS-CoV-2 viral copies this assay can detect is 250 copies / mL. A  negative result does not preclude SARS-CoV-2 infection and should not be used as the sole basis for treatment or other patient management decisions.  A negative result may occur with improper specimen collection / handling, submission of specimen other than nasopharyngeal swab, presence of viral mutation(s) within the areas targeted by this assay, and inadequate number of viral copies (<250 copies / mL). A negative result must be combined with clinical observations, patient history, and epidemiological information.  Fact Sheet for Patients:   BoilerBrush.com.cyhttps://www.fda.gov/media/136312/download  Fact Sheet for Healthcare Providers: https://pope.com/https://www.fda.gov/media/136313/download  This test is not yet approved or  cleared by the [place] FDA and has been authorized for detection and/or diagnosis of SARS-CoV-2 by FDA under an Emergency Use Authorization (EUA).  This EUA will remain in effect (meaning this test can be used) for the duration of the COVID-19 declaration under Section 564(b)(1) of the Act, 21 U.S.C. section 360bbb-3(b)(1), unless the authorization is terminated or revoked sooner.  Performed at [facility], [address] [place], [place] [address]   Group B strep by PCR     Status: None   Collection Time: 12/29/19  3:11 PM   Specimen: Vaginal/Rectal; Genital  Result Value Ref Range   Group B strep by PCR NEGATIVE NEGATIVE    Comment: (NOTE) Intrapartum testing with Xpert GBS assay should be used as an adjunct to other methods available and not used to replace antepartum testing (at 35-[redacted] weeks gestation). Performed at [facility], [address] [place], [place] [address]   CBC with Differential/Platelet     Status: Abnormal   Collection Time: 12/30/19  5:30 AM  Result Value Ref Range   WBC 9.9 4.0 - 10.5 K/uL   RBC 4.06 3.87 - 5.11 MIL/uL   Hemoglobin 12.2 12.0 - 15.0 g/dL   HCT 96.237.5 36 - 46 %   MCV 92.4 80.0 - 100.0 fL   MCH 30.0 26.0 - 34.0 pg   MCHC  32.5 30.0 - 36.0 g/dL   RDW 95.218.6 (H) 84.111.5 - 32.415.5 %   Platelets 187 150 - 400 K/uL   nRBC 0.0 0.0 - 0.2 %   Neutrophils Relative % 71 %   Neutro Abs 7.1 1.7 - 7.7 K/uL   Lymphocytes Relative 25 %   Lymphs Abs 2.5 0.7 - 4.0 K/uL   Monocytes Relative 3 %  Monocytes Absolute 0.3 0 - 1 K/uL   Eosinophils Relative 0 %   Eosinophils Absolute 0.0 0 - 0 K/uL   Basophils Relative 0 %   Basophils Absolute 0.0 0 - 0 K/uL   Immature Granulocytes 1 %   Abs Immature Granulocytes 0.09 (H) 0.00 - 0.07 K/uL    Comment: Performed at [facility], [address], [place], [place] [address]  Comprehensive metabolic panel     Status: Abnormal   Collection Time: 12/30/19  5:30 AM  Result Value Ref Range   Sodium 136 135 - 145 mmol/L   Potassium 4.2 3.5 - 5.1 mmol/L   Chloride 104 98 - 111 mmol/L   CO2 19 (L) 22 - 32 mmol/L   Glucose, Bld 108 (H) 70 - 99 mg/dL    Comment: Glucose reference range applies only to samples taken after fasting for at least 8 hours.   BUN 12 6 - 20 mg/dL   Creatinine, Ser 6.04 (H) 0.44 - 1.00 mg/dL   Calcium 9.1 8.9 - 54.0 mg/dL   Total Protein 5.5 (L) 6.5 - 8.1 g/dL   Albumin 2.2 (L) 3.5 - 5.0 g/dL   AST 30 15 - 41 U/L   ALT 16 0 - 44 U/L   Alkaline Phosphatase 192 (H) 38 - 126 U/L   Total Bilirubin 0.5 0.3 - 1.2 mg/dL   GFR calc non Af Amer >60 >60 mL/min   GFR calc Af Amer >60 >60 mL/min   Anion gap 13 5 - 15    Comment: Performed at [facility], [address] [place], [place] [address]    [place] MFM OB LIMITED  Result Date: 12/29/2019 ----------------------------------------------------------------------  OBSTETRICS REPORT                       (Signed Final 12/29/2019 04:05 pm) ---------------------------------------------------------------------- Patient Info  ID #:       [id number]                          D.O.B.:  09-06-1990 (29 yrs)  Name:       Kellie Ross                   Visit Date: 12/29/2019 03:41 pm  ---------------------------------------------------------------------- Performed By  Attending:        Ma Rings MD         Ref. Address:     [facility]                                                             [facility] [employer]                                                             [facility]  [address]                                                             [address]                                                             [place], [place]                                                             [address]  Performed By:     Marcellina Millin          Secondary Phy.:   [facility]                    RDMS                                                             Care  Referred By:      Lavina Hamman         [facility]                    MD                                       [facility] ---------------------------------------------------------------------- Orders  #  Description                           Code        Ordered By  1  [place] MFM OB LIMITED                     87867.67    TODD MEISINGER ----------------------------------------------------------------------  #  Order #  Accession #                Episode #  1  [id number]                   [id number]                 [id number] ---------------------------------------------------------------------- Indications  Twin pregnancy, di/di, third trimester         O30.043  Determine Fetal presentation by ultrasound     Z36.89  [redacted] weeks gestation of pregnancy                Z3A.34 ---------------------------------------------------------------------- Vital Signs                                                 Height:        5'10" ---------------------------------------------------------------------- Fetal  Evaluation (Fetus A)  Num Of Fetuses:         2  Fetal Heart Rate(bpm):  157  Cardiac Activity:       Observed  Fetal Lie:              Lower Fetus  Presentation:           Cephalic  Placenta:               Posterior  P. Cord Insertion:      Previously Visualized  Amniotic Fluid  AFI FV:      Within normal limits                              Largest Pocket(cm)                              5.5 ---------------------------------------------------------------------- OB History  Gravidity:    1 ---------------------------------------------------------------------- Gestational Age (Fetus A)  Best:          34w 0d     Det. ByMarcella Dubs         EDD:   02/09/20                                      (07/20/19) ---------------------------------------------------------------------- Anatomy (Fetus A)  Stomach:               Appears normal, left   Bladder:                Appears normal                         sided ---------------------------------------------------------------------- Fetal Evaluation (Fetus B)  Num Of Fetuses:         2  Fetal Heart Rate(bpm):  158  Cardiac Activity:       Observed  Fetal Lie:              Upper Fetus  Presentation:           Cephalic  Placenta:               Anterior  P. Cord Insertion:      Previously Visualized  Membrane Desc:      Dividing Membrane seen - Dichorionic.  Amniotic  Fluid  AFI FV:      Within normal limits                              Largest Pocket(cm)                              5 ---------------------------------------------------------------------- Gestational Age (Fetus B)  Best:          34w 0d     Det. ByMarcella Dubs         EDD:   02/09/20                                      (07/20/19) ---------------------------------------------------------------------- Anatomy (Fetus B)  Stomach:               Appears normal, left   Bladder:                Appears normal                         sided  ---------------------------------------------------------------------- Cervix Uterus Adnexa  Cervix  Not visualized (advanced GA >24wks) ---------------------------------------------------------------------- Comments  This patient was admitted due to a dichorionic, diamniotic  twin gestation with elevated blood pressures.  A limited ultrasound performed today shows that both twin A  and twin B are in the vertex/vertex presentations.  There was normal amniotic fluid noted around both fetuses. ----------------------------------------------------------------------                   Ma Rings, MD Electronically Signed Final Report   12/29/2019 04:05 pm ----------------------------------------------------------------------   ROS Blood pressure 140/87, pulse (!) 108, temperature 97.9 F (36.6 C), temperature source Oral, resp. rate 18, height 5\' 11"  (1.803 m), weight 129.3 kg, SpO2 100 %. Physical Exam Constitutional:      General: She is not in acute distress.    Appearance: Normal appearance. She is not ill-appearing.  HENT:     Head: Normocephalic and atraumatic.  Eyes:     Extraocular Movements: Extraocular movements intact.  Chest:     Comments: 1 cm erythematous friable pedunculated mass. Feels fluid filled/ cyst.  Neurological:     Mental Status: She is alert.         Assessment/Plan: Will plan to remove lesion. If possible, will do while she's in the hospital. If not, we can remove as outpatient in the office. Will remain in contact with the patient and her care team to arrange.  Thank you for the opportunity to consult on this patient.   , PA-C 12/30/2019, 12:05 PM

## 2019-12-30 NOTE — Progress Notes (Addendum)
Patient ID: Kellie Ross, female   DOB: [birth year], 29 y.o.   MRN: [id number]  Twins at 34+, PreE by BP and proteinuria - getting BMZ  No c/o's, +FM, no LOF, no VB, occ ctx, no PIH sx's.    130-167/ 81-106 (139/88), good uop gen NAD R breast - 1 cm pedunculated lesion, erythematous, friable CV RRR Lungs CTAB Abd soft, gravid FHTs A 130-140, mod var, + accels, category 1  B 130-140, mod var, + accels, category 1  Continue current mgmt Labetalol 200 bid and per PIH protocol Receiving BMZ (dose #2 4pm) Close monitoring Called plastic surgery [name]) re lesion on breast

## 2019-12-31 NOTE — Progress Notes (Signed)
Patient ID: Kellie Ross, female   DOB: 1991-01-01, 29 y.o.   MRN: [id number] HD #3 di/di twins preeclampsia  Pt reports slept well and no HA.  Mild nausea but thinks just hungry and some low back pain  Good FM x 2 No contractions  BP well-controlled 114-140/64-88  EFM category 1 x 2 yesterday afternoon, last PM strip was only tracing one baby, will monitor shortly  Gravid NT  Cervix 60/1/-2 Soft  Pt with preeclampsia by proteinuria (prot:creat 1.91) and increased BP currently well-controlled on labetalol 200mg  po BID Vtx/vtx on admission 12/29/19 S/p betamethasone x 2 (last dose 3pm 12/30/19) GBS negative on admission 12/29/19 LAbs WNL yesterday AM will recheck tomorrow AM unless becomes symptomatic or BP breaks through the low dose labetalol.   D/w pt low threshold for moving to delivery now that steroids on board--any increasing BP, symptoms, or lab abnormalities. Reviewed induction process briefly.

## 2020-01-01 LAB — CBC
HCT: 32.4 % — ABNORMAL LOW (ref 36.0–46.0)
Hemoglobin: 10.7 g/dL — ABNORMAL LOW (ref 12.0–15.0)
MCH: 31.4 pg (ref 26.0–34.0)
MCHC: 33 g/dL (ref 30.0–36.0)
MCV: 95 fL (ref 80.0–100.0)
Platelets: 156 10*3/uL (ref 150–400)
RBC: 3.41 MIL/uL — ABNORMAL LOW (ref 3.87–5.11)
RDW: 19.8 % — ABNORMAL HIGH (ref 11.5–15.5)
WBC: 12.9 10*3/uL — ABNORMAL HIGH (ref 4.0–10.5)
nRBC: 2.1 % — ABNORMAL HIGH (ref 0.0–0.2)

## 2020-01-01 LAB — COMPREHENSIVE METABOLIC PANEL
ALT: 17 U/L (ref 0–44)
AST: 35 U/L (ref 15–41)
Albumin: 2.1 g/dL — ABNORMAL LOW (ref 3.5–5.0)
Alkaline Phosphatase: 158 U/L — ABNORMAL HIGH (ref 38–126)
Anion gap: 7 (ref 5–15)
BUN: 17 mg/dL (ref 6–20)
CO2: 21 mmol/L — ABNORMAL LOW (ref 22–32)
Calcium: 8.2 mg/dL — ABNORMAL LOW (ref 8.9–10.3)
Chloride: 107 mmol/L (ref 98–111)
Creatinine, Ser: 1.01 mg/dL — ABNORMAL HIGH (ref 0.44–1.00)
GFR calc Af Amer: >60 mL/min (ref >60)
GFR calc non Af Amer: >60 mL/min (ref >60)
Glucose, Bld: 96 mg/dL (ref 70–99)
Potassium: 3.8 mmol/L (ref 3.5–5.1)
Sodium: 135 mmol/L (ref 135–145)
Total Bilirubin: 0.4 mg/dL (ref 0.3–1.2)
Total Protein: 5 g/dL — ABNORMAL LOW (ref 6.5–8.1)

## 2020-01-01 NOTE — Progress Notes (Signed)
Patient ID: Dutchess Crosland, female   DOB: May 13, 1990, 29 y.o.   MRN: [id number] HD#4 34 3/7 weeks di/di twins/ preeclampsia  Pt feels well.  No HA or other c/o.  +FM  BP remains well-controlled on labetalol 200mg  po BID 113-145/64-88 NST Category 1 x 2  Gravid NT   Pt with preeclampsia by proteinuria (prot:creat 1.91) and increased BP currently well-controlled on labetalol 200mg  po BID, no severe features S/p betamethasone x 2 9/1 and 9/2 GBS negative Babies vtx/vtx on 11/1 on admission Labs remain WNL, will check QOD unless develops severe features Plan IOL with severe features or at 37 weeks

## 2020-01-02 LAB — CBC
HCT: 33.7 % — ABNORMAL LOW (ref 36.0–46.0)
Hemoglobin: 10.9 g/dL — ABNORMAL LOW (ref 12.0–15.0)
MCH: 31.1 pg (ref 26.0–34.0)
MCHC: 32.3 g/dL (ref 30.0–36.0)
MCV: 96.3 fL (ref 80.0–100.0)
Platelets: 157 10*3/uL (ref 150–400)
RBC: 3.5 MIL/uL — ABNORMAL LOW (ref 3.87–5.11)
RDW: 19.9 % — ABNORMAL HIGH (ref 11.5–15.5)
WBC: 11.6 10*3/uL — ABNORMAL HIGH (ref 4.0–10.5)
nRBC: 2.2 % — ABNORMAL HIGH (ref 0.0–0.2)

## 2020-01-02 LAB — COMPREHENSIVE METABOLIC PANEL
ALT: 20 U/L (ref 0–44)
AST: 39 U/L (ref 15–41)
Albumin: 2 g/dL — ABNORMAL LOW (ref 3.5–5.0)
Alkaline Phosphatase: 144 U/L — ABNORMAL HIGH (ref 38–126)
Anion gap: 8 (ref 5–15)
BUN: 14 mg/dL (ref 6–20)
CO2: 21 mmol/L — ABNORMAL LOW (ref 22–32)
Calcium: 8.4 mg/dL — ABNORMAL LOW (ref 8.9–10.3)
Chloride: 111 mmol/L (ref 98–111)
Creatinine, Ser: 0.84 mg/dL (ref 0.44–1.00)
GFR calc Af Amer: >60 mL/min (ref >60)
GFR calc non Af Amer: >60 mL/min (ref >60)
Glucose, Bld: 77 mg/dL (ref 70–99)
Potassium: 4.5 mmol/L (ref 3.5–5.1)
Sodium: 140 mmol/L (ref 135–145)
Total Bilirubin: 0.7 mg/dL (ref 0.3–1.2)
Total Protein: 4.7 g/dL — ABNORMAL LOW (ref 6.5–8.1)

## 2020-01-02 MED ORDER — BUTALBITAL-APAP-CAFFEINE 50-325-40 MG PO TABS
2.0000 | ORAL_TABLET | Freq: Once | ORAL | Status: AC
  Administered 2020-01-02: 2 via ORAL
  Filled 2020-01-02: qty 2

## 2020-01-02 NOTE — Consult Note (Signed)
Neonatal Prenatal Consult       01/02/2020  12:10 PM   I was asked by Dr. Senaida Ores to consult on this patient for likely preterm delivery.  I had the pleasure of meeting with Mrs. Lish as well as the babies' father and maternal grandmother today.  Mrs. Kagan is a G1 pregnant with Di/Di twins, currently admitted for pre-eclampsia.  OB service is anticipating possible need for IOL in the next 24-48 hours. She has received betamethasone x2.   I explained that if the babies were born <[redacted] weeks gestation that the neonatal intensive care team would be present for the delivery and the girls would be admitted to the NICU. I outlined the likely delivery room course for the twins including routine resuscitation and NRP-guided approaches to the treatment of respiratory distress. We discussed other common problems associated with prematurity including respiratory distress syndrome, hypoglycemia, feeding issues, temperature regulation, and infection risk.  We discussed that should the twins be born 35-36 weeks gestatoin, there is still a high likelihood that they may need to come to the NICU for difficulty with any of the above issues.   We discussed the average length of stay but I noted that the actual LOS would depend on the severity of problems encountered and response to treatments.  We discussed visitation policies and the resources available while her child is in the hospital.  We discussed the importance of good nutrition and various methods of providing nutrition (parenteral hyperalimentation, gavage feedings and/or oral feeding). We discussed the benefits of human milk. I encouraged breast feeding and pumping soon after birth and outlined resources that are available to support breast feeding.    Thank you for involving [place] in the care of this patient. A member of our team will be available should the family have additional questions.  Time for consultation approximately 20 minutes.    _____________________ Electronically Signed By: Karie Schwalbe, MD, MS Neonatologist

## 2020-01-02 NOTE — Progress Notes (Signed)
Patient ID: Kellie Ross, female   DOB: 03-11-1991, 29 y.o.   MRN: [id number] HD#5 34 4/7 weeks di/di twins/ preeclampsia  Pt reports feels well with no HA or PIH sx.  Good FM  BP 130-150/72-107 EFM category 1 x 2  Gravid NT Bedside US with vtx/vtx presentation  Labs WNL this AM S/p betamethsome x 2 GBS negative on admission BP trending up but not severe range.  D/w Dr. Grace Bushy and he suggests d/c labetalol so can get accurate representation of BP and if progress to severe range move towards delivery D/w pt and family at length and reviewed induction process Will get NICU consult today to review preterm care with patient Continue to follow closely

## 2020-01-03 LAB — CBC
HCT: 35 % — ABNORMAL LOW (ref 36.0–46.0)
HCT: 37.4 % (ref 36.0–46.0)
Hemoglobin: 11.1 g/dL — ABNORMAL LOW (ref 12.0–15.0)
Hemoglobin: 12 g/dL (ref 12.0–15.0)
MCH: 30.3 pg (ref 26.0–34.0)
MCH: 30.7 pg (ref 26.0–34.0)
MCHC: 31.7 g/dL (ref 30.0–36.0)
MCHC: 32.1 g/dL (ref 30.0–36.0)
MCV: 95.6 fL (ref 80.0–100.0)
MCV: 95.7 fL (ref 80.0–100.0)
Platelets: 145 10*3/uL — ABNORMAL LOW (ref 150–400)
Platelets: 165 10*3/uL (ref 150–400)
RBC: 3.66 MIL/uL — ABNORMAL LOW (ref 3.87–5.11)
RBC: 3.91 MIL/uL (ref 3.87–5.11)
RDW: 19.6 % — ABNORMAL HIGH (ref 11.5–15.5)
RDW: 20.2 % — ABNORMAL HIGH (ref 11.5–15.5)
WBC: 10.8 10*3/uL — ABNORMAL HIGH (ref 4.0–10.5)
WBC: 10 10*3/uL (ref 4.0–10.5)
nRBC: 1.1 % — ABNORMAL HIGH (ref 0.0–0.2)
nRBC: 1 % — ABNORMAL HIGH (ref 0.0–0.2)

## 2020-01-03 LAB — COMPREHENSIVE METABOLIC PANEL
ALT: 22 U/L (ref 0–44)
AST: 43 U/L — ABNORMAL HIGH (ref 15–41)
Albumin: 2.1 g/dL — ABNORMAL LOW (ref 3.5–5.0)
Alkaline Phosphatase: 167 U/L — ABNORMAL HIGH (ref 38–126)
Anion gap: 6 (ref 5–15)
BUN: 12 mg/dL (ref 6–20)
CO2: 24 mmol/L (ref 22–32)
Calcium: 8.5 mg/dL — ABNORMAL LOW (ref 8.9–10.3)
Chloride: 108 mmol/L (ref 98–111)
Creatinine, Ser: 0.89 mg/dL (ref 0.44–1.00)
GFR calc Af Amer: >60 mL/min (ref >60)
GFR calc non Af Amer: >60 mL/min (ref >60)
Glucose, Bld: 90 mg/dL (ref 70–99)
Potassium: 4.5 mmol/L (ref 3.5–5.1)
Sodium: 138 mmol/L (ref 135–145)
Total Bilirubin: 0.5 mg/dL (ref 0.3–1.2)
Total Protein: 4.8 g/dL — ABNORMAL LOW (ref 6.5–8.1)

## 2020-01-03 MED ORDER — OXYTOCIN-SODIUM CHLORIDE 30-0.9 UT/500ML-% IV SOLN
2.5000 [IU]/h | INTRAVENOUS | Status: DC
  Filled 2020-01-03: qty 500

## 2020-01-03 MED ORDER — ONDANSETRON HCL 4 MG/2ML IJ SOLN
4.0000 mg | Freq: Four times a day (QID) | INTRAMUSCULAR | Status: DC | PRN
  Administered 2020-01-04: 4 mg via INTRAVENOUS
  Filled 2020-01-03: qty 2

## 2020-01-03 MED ORDER — LACTATED RINGERS IV SOLN: INTRAVENOUS | Status: DC

## 2020-01-03 MED ORDER — LABETALOL HCL 5 MG/ML IV SOLN: 40.0000 mg | INTRAVENOUS | Status: DC | PRN

## 2020-01-03 MED ORDER — BUTORPHANOL TARTRATE 1 MG/ML IJ SOLN: 1.0000 mg | INTRAMUSCULAR | Status: DC | PRN

## 2020-01-03 MED ORDER — OXYCODONE-ACETAMINOPHEN 5-325 MG PO TABS: 1.0000 | ORAL_TABLET | ORAL | Status: DC | PRN

## 2020-01-03 MED ORDER — TERBUTALINE SULFATE 1 MG/ML IJ SOLN: 0.2500 mg | Freq: Once | INTRAMUSCULAR | Status: DC | PRN

## 2020-01-03 MED ORDER — LIDOCAINE HCL (PF) 1 % IJ SOLN: 30.0000 mL | INTRAMUSCULAR | Status: DC | PRN

## 2020-01-03 MED ORDER — OXYTOCIN BOLUS FROM INFUSION
333.0000 mL | Freq: Once | INTRAVENOUS | Status: AC
  Administered 2020-01-04: 333 mL via INTRAVENOUS

## 2020-01-03 MED ORDER — MAGNESIUM SULFATE 40 GM/1000ML IV SOLN
1.0000 g/h | INTRAVENOUS | Status: DC
  Filled 2020-01-03 (×2): qty 1000

## 2020-01-03 MED ORDER — BUTALBITAL-APAP-CAFFEINE 50-325-40 MG PO TABS
2.0000 | ORAL_TABLET | Freq: Once | ORAL | Status: AC
  Administered 2020-01-03: 2 via ORAL
  Filled 2020-01-03: qty 2

## 2020-01-03 MED ORDER — SOD CITRATE-CITRIC ACID 500-334 MG/5ML PO SOLN: 30.0000 mL | ORAL | Status: DC | PRN

## 2020-01-03 MED ORDER — LACTATED RINGERS IV SOLN
500.0000 mL | INTRAVENOUS | Status: DC | PRN
  Administered 2020-01-04: 1000 mL via INTRAVENOUS

## 2020-01-03 MED ORDER — MISOPROSTOL 25 MCG QUARTER TABLET
25.0000 ug | ORAL_TABLET | ORAL | Status: DC | PRN
  Administered 2020-01-03 – 2020-01-04 (×3): 25 ug via VAGINAL
  Filled 2020-01-03 (×3): qty 1

## 2020-01-03 MED ORDER — ACETAMINOPHEN 325 MG PO TABS
650.0000 mg | ORAL_TABLET | ORAL | Status: DC | PRN
  Administered 2020-01-04 (×2): 650 mg via ORAL
  Filled 2020-01-03 (×2): qty 2

## 2020-01-03 MED ORDER — OXYCODONE-ACETAMINOPHEN 5-325 MG PO TABS: 2.0000 | ORAL_TABLET | ORAL | Status: DC | PRN

## 2020-01-03 MED ORDER — MAGNESIUM SULFATE BOLUS VIA INFUSION
6.0000 g | Freq: Once | INTRAVENOUS | Status: AC
  Administered 2020-01-03: 6 g via INTRAVENOUS
  Filled 2020-01-03: qty 1000

## 2020-01-03 MED ORDER — HYDRALAZINE HCL 20 MG/ML IJ SOLN: 10.0000 mg | INTRAMUSCULAR | Status: DC | PRN

## 2020-01-03 MED ORDER — LABETALOL HCL 5 MG/ML IV SOLN
20.0000 mg | INTRAVENOUS | Status: DC | PRN
  Administered 2020-01-03: 20 mg via INTRAVENOUS
  Filled 2020-01-03: qty 4

## 2020-01-03 MED ORDER — LABETALOL HCL 5 MG/ML IV SOLN: 80.0000 mg | INTRAVENOUS | Status: DC | PRN

## 2020-01-03 NOTE — Progress Notes (Signed)
She started getting a headache aura, feels ok otherwise Afeb, VSS, recent BPs 176/104 and 169/104 FHT- reactive x 2  Since has had more severe BP and possible CNS sx, received IV labetalol for BP, will move to L&D for ripening and induction, start magnesium for seizure prophylaxis

## 2020-01-03 NOTE — Progress Notes (Signed)
Patient reports loss of visual field on right side that she describes like a moving television screen, "white noise." She states that she often has this occurrence preceding a migraine. Dr. Jackelyn Knife called and notified of same as well as BP of 176/104. Will give Fioricet 2 tabs now and recheck BP in 15 minutes.

## 2020-01-03 NOTE — Progress Notes (Signed)
HD #6, [redacted]W[redacted]D, di/di twins, preeclampsia  Feels ok, no PIH sx, +FM x 2 Afeb, VSS, BP 140-150/80-90 FHT-reactive x 2  Labs stable, AST up slightly to 43, plt down slightly to 145k  Will continue to monitor closely for signs/sx of severe preeclampsia, induce labor if it develops.  Repeat labs tomorrow.  Last growth u/s was on 8-16

## 2020-01-04 ENCOUNTER — Inpatient Hospital Stay (HOSPITAL_COMMUNITY): Payer: BC Managed Care – PPO | Admitting: Anesthesiology

## 2020-01-04 ENCOUNTER — Encounter (HOSPITAL_COMMUNITY)
Admission: AD | Disposition: A | Discharge status: Home / Self Care | Payer: Self-pay | Source: Home / Self Care | Attending: Obstetrics and Gynecology

## 2020-01-04 ENCOUNTER — Encounter (HOSPITAL_COMMUNITY): Payer: Self-pay | Admitting: Obstetrics and Gynecology

## 2020-01-04 DIAGNOSIS — O30009 Twin pregnancy, unspecified number of placenta and unspecified number of amniotic sacs, unspecified trimester: Secondary | ICD-10-CM

## 2020-01-04 LAB — CBC
HCT: 24.1 % — ABNORMAL LOW (ref 36.0–46.0)
HCT: 27.1 % — ABNORMAL LOW (ref 36.0–46.0)
Hemoglobin: 7.6 g/dL — ABNORMAL LOW (ref 12.0–15.0)
Hemoglobin: 8.8 g/dL — ABNORMAL LOW (ref 12.0–15.0)
MCH: 31.9 pg (ref 26.0–34.0)
MCH: 31.1 pg (ref 26.0–34.0)
MCHC: 31.5 g/dL (ref 30.0–36.0)
MCHC: 32.5 g/dL (ref 30.0–36.0)
MCV: 101.3 fL — ABNORMAL HIGH (ref 80.0–100.0)
MCV: 95.8 fL (ref 80.0–100.0)
Platelets: 119 10*3/uL — ABNORMAL LOW (ref 150–400)
Platelets: 138 10*3/uL — ABNORMAL LOW (ref 150–400)
RBC: 2.38 MIL/uL — ABNORMAL LOW (ref 3.87–5.11)
RBC: 2.83 MIL/uL — ABNORMAL LOW (ref 3.87–5.11)
RDW: 20.2 % — ABNORMAL HIGH (ref 11.5–15.5)
RDW: 20.1 % — ABNORMAL HIGH (ref 11.5–15.5)
WBC: 8.4 10*3/uL (ref 4.0–10.5)
WBC: 13.4 10*3/uL — ABNORMAL HIGH (ref 4.0–10.5)
nRBC: 0.2 % (ref 0.0–0.2)
nRBC: 0.2 % (ref 0.0–0.2)

## 2020-01-04 LAB — DIC (DISSEMINATED INTRAVASCULAR COAGULATION)PANEL
D-Dimer, Quant: 3.84 ug/mL-FEU — ABNORMAL HIGH (ref 0.00–0.50)
Fibrinogen: 314 mg/dL (ref 210–475)
INR: 1.1 (ref 0.8–1.2)
Platelets: 145 10*3/uL — ABNORMAL LOW (ref 150–400)
Prothrombin Time: 13.8 seconds (ref 11.4–15.2)
Smear Review: NONE SEEN
aPTT: 28 seconds (ref 24–36)

## 2020-01-04 LAB — CBC WITH DIFFERENTIAL/PLATELET
Abs Immature Granulocytes: 0.09 10*3/uL — ABNORMAL HIGH (ref 0.00–0.07)
Basophils Absolute: 0 10*3/uL (ref 0.0–0.1)
Basophils Relative: 0 %
Eosinophils Absolute: 0 10*3/uL (ref 0.0–0.5)
Eosinophils Relative: 0 %
HCT: 38.3 % (ref 36.0–46.0)
Hemoglobin: 12.3 g/dL (ref 12.0–15.0)
Immature Granulocytes: 1 %
Lymphocytes Relative: 31 %
Lymphs Abs: 3.3 10*3/uL (ref 0.7–4.0)
MCH: 30.5 pg (ref 26.0–34.0)
MCHC: 32.1 g/dL (ref 30.0–36.0)
MCV: 95 fL (ref 80.0–100.0)
Monocytes Absolute: 0.7 10*3/uL (ref 0.1–1.0)
Monocytes Relative: 6 %
Neutro Abs: 6.4 10*3/uL (ref 1.7–7.7)
Neutrophils Relative %: 62 %
Platelets: 157 10*3/uL (ref 150–400)
RBC: 4.03 MIL/uL (ref 3.87–5.11)
RDW: 20.6 % — ABNORMAL HIGH (ref 11.5–15.5)
WBC: 10.5 10*3/uL (ref 4.0–10.5)
nRBC: 0.4 % — ABNORMAL HIGH (ref 0.0–0.2)

## 2020-01-04 LAB — POSTPARTUM HEMORRHAGE PROTOCOL (BB NOTIFICATION)

## 2020-01-04 LAB — COMPREHENSIVE METABOLIC PANEL
ALT: 20 U/L (ref 0–44)
AST: 46 U/L — ABNORMAL HIGH (ref 15–41)
Albumin: 2.3 g/dL — ABNORMAL LOW (ref 3.5–5.0)
Alkaline Phosphatase: 187 U/L — ABNORMAL HIGH (ref 38–126)
Anion gap: 9 (ref 5–15)
BUN: 11 mg/dL (ref 6–20)
CO2: 21 mmol/L — ABNORMAL LOW (ref 22–32)
Calcium: 8.4 mg/dL — ABNORMAL LOW (ref 8.9–10.3)
Chloride: 102 mmol/L (ref 98–111)
Creatinine, Ser: 0.79 mg/dL (ref 0.44–1.00)
GFR calc Af Amer: >60 mL/min (ref >60)
GFR calc non Af Amer: >60 mL/min (ref >60)
Glucose, Bld: 82 mg/dL (ref 70–99)
Potassium: 4.1 mmol/L (ref 3.5–5.1)
Sodium: 132 mmol/L — ABNORMAL LOW (ref 135–145)
Total Bilirubin: 0.7 mg/dL (ref 0.3–1.2)
Total Protein: 5.3 g/dL — ABNORMAL LOW (ref 6.5–8.1)

## 2020-01-04 LAB — MAGNESIUM
Magnesium: 5.4 mg/dL — ABNORMAL HIGH (ref 1.7–2.4)
Magnesium: 4.5 mg/dL — ABNORMAL HIGH (ref 1.7–2.4)

## 2020-01-04 LAB — PREPARE RBC (CROSSMATCH)

## 2020-01-04 LAB — RPR: RPR Ser Ql: NONREACTIVE

## 2020-01-04 SURGERY — Surgical Case
Wound class: Clean

## 2020-01-04 MED ORDER — ONDANSETRON HCL 4 MG/2ML IJ SOLN: 4.0000 mg | INTRAMUSCULAR | Status: DC | PRN

## 2020-01-04 MED ORDER — SENNOSIDES-DOCUSATE SODIUM 8.6-50 MG PO TABS
2.0000 | ORAL_TABLET | ORAL | Status: DC
  Administered 2020-01-05 – 2020-01-06 (×3): 2 via ORAL
  Filled 2020-01-04 (×3): qty 2

## 2020-01-04 MED ORDER — FUROSEMIDE 10 MG/ML IJ SOLN
20.0000 mg | Freq: Once | INTRAMUSCULAR | Status: AC
  Administered 2020-01-05: 20 mg via INTRAVENOUS
  Filled 2020-01-04: qty 2

## 2020-01-04 MED ORDER — ONDANSETRON HCL 4 MG PO TABS: 4.0000 mg | ORAL_TABLET | ORAL | Status: DC | PRN

## 2020-01-04 MED ORDER — ACETAMINOPHEN 325 MG PO TABS
650.0000 mg | ORAL_TABLET | ORAL | Status: DC | PRN
  Administered 2020-01-05: 650 mg via ORAL
  Filled 2020-01-04: qty 2

## 2020-01-04 MED ORDER — DIPHENHYDRAMINE HCL 50 MG/ML IJ SOLN: 12.5000 mg | INTRAMUSCULAR | Status: DC | PRN

## 2020-01-04 MED ORDER — LACTATED RINGERS IV SOLN: INTRAVENOUS | Status: DC

## 2020-01-04 MED ORDER — METHYLERGONOVINE MALEATE 0.2 MG PO TABS: 0.2000 mg | ORAL_TABLET | ORAL | Status: DC | PRN

## 2020-01-04 MED ORDER — BENZOCAINE-MENTHOL 20-0.5 % EX AERO
1.0000 "application " | INHALATION_SPRAY | CUTANEOUS | Status: DC | PRN
  Filled 2020-01-04: qty 56

## 2020-01-04 MED ORDER — LACTATED RINGERS IV SOLN
500.0000 mL | Freq: Once | INTRAVENOUS | Status: AC
  Administered 2020-01-04: 500 mL via INTRAVENOUS

## 2020-01-04 MED ORDER — ALBUMIN HUMAN 5 % IV SOLN
INTRAVENOUS | Status: AC
  Filled 2020-01-04: qty 250

## 2020-01-04 MED ORDER — ALBUMIN HUMAN 5 % IV SOLN
12.5000 g | Freq: Once | INTRAVENOUS | Status: AC
  Administered 2020-01-04: 12.5 g via INTRAVENOUS

## 2020-01-04 MED ORDER — IBUPROFEN 600 MG PO TABS
600.0000 mg | ORAL_TABLET | Freq: Four times a day (QID) | ORAL | Status: DC
  Administered 2020-01-05 – 2020-01-07 (×8): 600 mg via ORAL
  Filled 2020-01-04 (×8): qty 1

## 2020-01-04 MED ORDER — METHYLERGONOVINE MALEATE 0.2 MG/ML IJ SOLN: 0.2000 mg | INTRAMUSCULAR | Status: DC | PRN

## 2020-01-04 MED ORDER — PHENYLEPHRINE 40 MCG/ML (10ML) SYRINGE FOR IV PUSH (FOR BLOOD PRESSURE SUPPORT): 80.0000 ug | PREFILLED_SYRINGE | INTRAVENOUS | Status: DC | PRN

## 2020-01-04 MED ORDER — SIMETHICONE 80 MG PO CHEW: 80.0000 mg | CHEWABLE_TABLET | ORAL | Status: DC | PRN

## 2020-01-04 MED ORDER — DIPHENHYDRAMINE HCL 25 MG PO CAPS: 25.0000 mg | ORAL_CAPSULE | Freq: Four times a day (QID) | ORAL | Status: DC | PRN

## 2020-01-04 MED ORDER — BUPIVACAINE HCL (PF) 0.75 % IJ SOLN
INTRAMUSCULAR | Status: DC | PRN
Start: 2020-01-04 — End: 2020-01-05
  Administered 2020-01-04: 12 mL/h via EPIDURAL

## 2020-01-04 MED ORDER — PRENATAL MULTIVITAMIN CH
1.0000 | ORAL_TABLET | Freq: Every day | ORAL | Status: DC
  Administered 2020-01-05 – 2020-01-06 (×2): 1 via ORAL
  Filled 2020-01-04 (×2): qty 1

## 2020-01-04 MED ORDER — OXYCODONE HCL 5 MG PO TABS: 10.0000 mg | ORAL_TABLET | ORAL | Status: DC | PRN

## 2020-01-04 MED ORDER — SODIUM CHLORIDE 0.9 % IV SOLN
3.0000 g | Freq: Once | INTRAVENOUS | Status: AC
  Administered 2020-01-04: 3 g via INTRAVENOUS
  Filled 2020-01-04: qty 8

## 2020-01-04 MED ORDER — EPHEDRINE 5 MG/ML INJ: 10.0000 mg | INTRAVENOUS | Status: DC | PRN

## 2020-01-04 MED ORDER — SODIUM CHLORIDE 0.9 % IV SOLN
3.0000 g | Freq: Four times a day (QID) | INTRAVENOUS | Status: AC
  Administered 2020-01-05 (×3): 3 g via INTRAVENOUS
  Filled 2020-01-04 (×3): qty 8

## 2020-01-04 MED ORDER — FENTANYL-BUPIVACAINE-NACL 0.5-0.125-0.9 MG/250ML-% EP SOLN: 12.0000 mL/h | EPIDURAL | Status: DC | PRN

## 2020-01-04 MED ORDER — DIBUCAINE (PERIANAL) 1 % EX OINT: 1.0000 "application " | TOPICAL_OINTMENT | CUTANEOUS | Status: DC | PRN

## 2020-01-04 MED ORDER — TETANUS-DIPHTH-ACELL PERTUSSIS 5-2.5-18.5 LF-MCG/0.5 IM SUSP: 0.5000 mL | Freq: Once | INTRAMUSCULAR | Status: DC

## 2020-01-04 MED ORDER — LIDOCAINE HCL (PF) 1 % IJ SOLN
INTRAMUSCULAR | Status: DC | PRN
  Administered 2020-01-04: 8 mL via EPIDURAL

## 2020-01-04 MED ORDER — SODIUM CHLORIDE 0.9% IV SOLUTION: Freq: Once | INTRAVENOUS | Status: AC

## 2020-01-04 MED ORDER — WITCH HAZEL-GLYCERIN EX PADS: 1.0000 "application " | MEDICATED_PAD | CUTANEOUS | Status: DC | PRN

## 2020-01-04 MED ORDER — LACTATED RINGERS IV BOLUS: 1000.0000 mL | Freq: Once | INTRAVENOUS | Status: AC

## 2020-01-04 MED ORDER — OXYCODONE HCL 5 MG PO TABS: 5.0000 mg | ORAL_TABLET | ORAL | Status: DC | PRN

## 2020-01-04 MED ORDER — FUROSEMIDE 10 MG/ML IJ SOLN
20.0000 mg | Freq: Once | INTRAMUSCULAR | Status: AC
  Administered 2020-01-04: 20 mg via INTRAVENOUS
  Filled 2020-01-04: qty 2

## 2020-01-04 MED ORDER — OXYTOCIN-SODIUM CHLORIDE 30-0.9 UT/500ML-% IV SOLN
1.0000 m[IU]/min | INTRAVENOUS | Status: DC
  Administered 2020-01-04: 2 m[IU]/min via INTRAVENOUS
  Filled 2020-01-04: qty 500

## 2020-01-04 MED ORDER — ZOLPIDEM TARTRATE 5 MG PO TABS: 5.0000 mg | ORAL_TABLET | Freq: Every evening | ORAL | Status: DC | PRN

## 2020-01-04 MED ORDER — FENTANYL-BUPIVACAINE-NACL 0.5-0.125-0.9 MG/250ML-% EP SOLN
EPIDURAL | Status: AC
  Filled 2020-01-04: qty 250

## 2020-01-04 MED ORDER — COCONUT OIL OIL: 1.0000 "application " | TOPICAL_OIL | Status: DC | PRN

## 2020-01-04 MED ORDER — FERROUS SULFATE 325 (65 FE) MG PO TABS
325.0000 mg | ORAL_TABLET | Freq: Two times a day (BID) | ORAL | Status: DC
  Administered 2020-01-05 – 2020-01-07 (×5): 325 mg via ORAL
  Filled 2020-01-04 (×4): qty 1

## 2020-01-04 MED ORDER — TRANEXAMIC ACID-NACL 1000-0.7 MG/100ML-% IV SOLN
INTRAVENOUS | Status: AC
  Administered 2020-01-04: 1000 mg
  Filled 2020-01-04: qty 100

## 2020-01-04 NOTE — Progress Notes (Signed)
Patient ID: Kellie Ross, female   DOB: 04-Nov-1990, 29 y.o.   MRN: [id number] While finishing up in the OR I was called and informed that pt is now complete and at +1 station. No urge to push  On arrival into the room, I confirmed exam Pt with epidural - comfortable.  Pt not appreciating contractions; mild pressure only.   EFM - A: 130, cat 1           B: 120s, cat 1 TOCO - contractions q 5-32mins  SVE 10/10/+1 Pit at  Plan: OR setup confirmed.          Trial of pushing perfomed; pt needs to labor down a little bit more to effect better pushing; still fatigued/muscle weak           Anticipate svd imminently

## 2020-01-04 NOTE — Plan of Care (Signed)

## 2020-01-04 NOTE — Progress Notes (Signed)
Feeling some ctx, mild headache, received 3rd cytotec around 0130 Afeb, VSS, BP 139-160/80-101 FHT- Cat I x 2, ctx q 2-4 min  Will continue magnesium for seizure prophylaxis, continue IV Labetalol prn-has required 2 doses so far, will start pitocin at 0530 and monitor progress

## 2020-01-04 NOTE — Anesthesia Preprocedure Evaluation (Addendum)
Anesthesia Evaluation  Patient identified by MRN, date of birth, ID band  Reviewed: Allergy & Precautions, NPO status , Patient's Chart, lab work & pertinent test results  Airway Mallampati: II       Dental no notable dental hx.    Pulmonary neg pulmonary ROS,    Pulmonary exam normal breath sounds clear to auscultation       Cardiovascular hypertension, Normal cardiovascular exam Rhythm:Regular Rate:Normal  Preeclampsia on magnesium   Neuro/Psych  Headaches, negative psych ROS   GI/Hepatic GERD  ,  Endo/Other    Renal/GU      Musculoskeletal  (+) Arthritis ,   Abdominal   Peds  Hematology   Anesthesia Other Findings   Reproductive/Obstetrics                            Anesthesia Physical Anesthesia Plan  ASA: III  Anesthesia Plan:    Post-op Pain Management:    Induction:   PONV Risk Score and Plan: 2  Airway Management Planned: Nasal Cannula  Additional Equipment:   Intra-op Plan:   Post-operative Plan:   Informed Consent: I have reviewed the patients History and Physical, chart, labs and discussed the procedure including the risks, benefits and alternatives for the proposed anesthesia with the patient or authorized representative who has indicated his/her understanding and acceptance.       Plan Discussed with:   Anesthesia Plan Comments:         Anesthesia Quick Evaluation

## 2020-01-04 NOTE — Progress Notes (Addendum)
Patient ID: Kellie Ross, female   DOB: 1990-10-21, 29 y.o.   MRN: [id number] Checked in on pt and she was resting in bed. Pt appeared pale and fatigued. She admitted to feeling "out of it" and very tired.  VSS: 134/79 ( 134-155/72-79),  87, 16, 98.4 GEN - pale cheeks and lips , NAD ABD - Fundus still firm and at level marked on abdomen; nontender EXT - +3 edema b/l ; scds in place  A/P: PPD#0 s/p vaginal delivery of twins with postpartum hemorrage         -Appears pale and fatigued: check cbc, magnesium level now         - Advised on amount of blood loss per Hg level; recommend pt receive transfusion to improve anemia - agrees; orders for pretreatment and treatment in between units to decrease further fluid retention         - Continue routine postpartum care

## 2020-01-04 NOTE — Progress Notes (Addendum)
Kellie Ross is a 29 y.o. G1P0 at [redacted]w[redacted]d by ultrasound admitted for induction of labor due to Pre-eclamptic toxemia of pregnancy.. Pt reports mild HA since last night- no exacerbation. No visual changes or SOB. She has been on MgSO4 since 3pm yesterday - reports feeling weak, "I have no muscle strength". She is appreciating contractions and FMs  Subjective: NAD Diffuse edema worse in LE  Objective: BP (!) 152/85   Pulse 80   Temp 98.4 F (36.9 C) (Oral)   Resp 14   Ht 5\' 11"  (1.803 m)   Wt 129.3 kg   SpO2 99%   BMI 39.75 kg/m  I/O last 3 completed shifts: In: 3500.6 [P.O.:761; I.V.:2739.6] Out: 3400 [Urine:3400] Total I/O In: 472.7 [P.O.:222; I.V.:250.7] Out: 800 [Urine:800]  FHT:  FHR: 130 and 120 bpm, variability: moderate,  accelerations:  Present,  decelerations:  Absent UC:   regular, every 1-3 minutes SVE:   Dilation: (P) 4 Effacement (%): (P) 80 Station: (P) -2 Exam by:: (P) Dr. [email or web address]  Labs: Lab Results  Component Value Date   WBC 10.5 01/04/2020   HGB 12.3 01/04/2020   HCT 38.3 01/04/2020   MCV 95.0 01/04/2020   PLT 157 01/04/2020    Assessment / Plan: Induction of labor due to preeclampsia,  progressing well on pitocin  Labor: progressing on pitocin ; now at 03/05/2020. AROM performed on baby A - clear fluid Preeclampsia:  on magnesium sulfate and will check Mg Level now ; decrease rate if indicated. Currently running at 2gm/hr Fetal Wellbeing:  Category I and cat 1 Pain Control:  tolerable at present - plans to get epidural later I/D:  n/a Anticipated MOD:  NSVD and NSVD. Discussed due to twin pregnancy, I would prefer to effect delivery in OR in case baby B changes position after baby A delivered and needs c/s for delivery  Continue to monitor BP  Kellie Ross 01/04/2020, 9:08 AM

## 2020-01-04 NOTE — Anesthesia Procedure Notes (Signed)
Epidural Patient location during procedure: OB Start time: 01/04/2020 9:50 AM End time: 01/04/2020 9:58 AM  Staffing Anesthesiologist: Mellody Dance, MD Performed: anesthesiologist   Preanesthetic Checklist Completed: patient identified, IV checked, site marked, risks and benefits discussed, monitors and equipment checked, pre-op evaluation and timeout performed  Epidural Patient position: sitting Prep: DuraPrep Patient monitoring: heart rate, cardiac monitor, continuous pulse ox and blood pressure Approach: midline Location: L3-L4 Injection technique: LOR saline  Needle:  Needle type: Tuohy  Needle gauge: 17 G Needle length: 9 cm Needle insertion depth: 6 cm Catheter type: closed end flexible Catheter size: 20 Guage Catheter at skin depth: 11 cm Test dose: negative and Other  Assessment Events: blood not aspirated, injection not painful, no injection resistance and negative IV test  Additional Notes Informed consent obtained prior to proceeding including risk of failure, 1% risk of PDPH, risk of minor discomfort and bruising.  Discussed rare but serious complications including epidural abscess, permanent nerve injury, epidural hematoma.  Discussed alternatives to epidural analgesia and patient desires to proceed.  Timeout performed pre-procedure verifying patient name, procedure, and platelet count.  Patient tolerated procedure well.

## 2020-01-04 NOTE — Lactation Note (Addendum)
This note was copied from a baby's chart. Lactation Consultation Note  Patient Name: Kellie Ross EUMPN'T Date: 01/04/2020 Reason for consult: Initial assessment;Primapara;1st time breastfeeding;NICU baby;Multiple gestation;Infant < 6lbs;Late-preterm 34-36.6wks Telephone call from tech who reports mom is ready to see lactation.  LC went as requested and took NICU booklet and started teaching with mom and dad. RN came in and said mom is going to get a blood transfusion that would be better to come back at a later time.   Dad reports he had called before he knew about the blood transfusion. Dad reports he knew they were supposed to get pumping in a certain number of hours.LC congratulated parents and encouraged them to call lactation as needed.  Maternal Data Formula Feeding for Exclusion: Yes Reason for exclusion: Admission to Intensive Care Unit (ICU) post-partum Has patient been taught Hand Expression?: No Does the patient have breastfeeding experience prior to this delivery?: No  Feeding    LATCH Score                   Interventions Interventions: DEBP  Lactation Tools Discussed/Used Tools: Pump Breast pump type: Double-Electric Breast Pump WIC Program: No Initiated by:: Erby Pian RN IBCLC Date initiated:: 01/04/20 (Mom not feeling well, but would like to initiate pumping by 6 hrs pp)   Consult Status Consult Status: Follow-up Date: 01/05/20 Follow-up type: In-patient    [facility] Michaelle Copas 01/04/2020, 9:19 PM

## 2020-01-04 NOTE — Lactation Note (Signed)
This note was copied from a baby's chart. Lactation Consultation Note  Patient Name: Kellie Ross XUXYB'F Date: 01/04/2020 Reason for consult: Initial assessment;Primapara;1st time breastfeeding;NICU baby;Multiple gestation;Infant < 6lbs;Late-preterm 34-36.6wks  LC in to visit with P1 Mom and FOB of [redacted]w[redacted]d twins in the NICU.  Babies are 3 hrs old, on room air and being gavage fed donor breast milk.    Mom is feeling poorly and asked for assistance with breast pumping and hand expression at a later time.  Mom's eyes closing as she is talking and she is very pale.  RN states she lost blood after vaginal delivery and is on MgSO4 for HBP.    LC set up DEBP at bedside and washing and drying bins.  NICU booklet and lactation brochure left in room.  FOB and RN asked to call for assistance with establishing her pumping routine when she is feeling better.   Interventions Interventions: DEBP  Lactation Tools Discussed/Used Tools: Pump Breast pump type: Double-Electric Breast Pump WIC Program: No Pump Review: Setup, frequency, and cleaning Initiated by:: Erby Pian RN IBCLC Date initiated:: 01/04/20   Consult Status Consult Status: Follow-up Date: 01/05/20 Follow-up type: In-patient    [name] 01/04/2020, 6:26 PM

## 2020-01-05 LAB — CBC
HCT: 34.9 % — ABNORMAL LOW (ref 36.0–46.0)
Hemoglobin: 11.7 g/dL — ABNORMAL LOW (ref 12.0–15.0)
MCH: 30.9 pg (ref 26.0–34.0)
MCHC: 33.5 g/dL (ref 30.0–36.0)
MCV: 92.1 fL (ref 80.0–100.0)
Platelets: 166 10*3/uL (ref 150–400)
RBC: 3.79 MIL/uL — ABNORMAL LOW (ref 3.87–5.11)
RDW: 20.7 % — ABNORMAL HIGH (ref 11.5–15.5)
WBC: 13.6 10*3/uL — ABNORMAL HIGH (ref 4.0–10.5)
nRBC: 0.2 % (ref 0.0–0.2)

## 2020-01-05 MED ORDER — NIFEDIPINE ER OSMOTIC RELEASE 30 MG PO TB24
30.0000 mg | ORAL_TABLET | Freq: Every day | ORAL | Status: DC
  Administered 2020-01-05 – 2020-01-06 (×2): 30 mg via ORAL
  Filled 2020-01-05 (×2): qty 1

## 2020-01-05 NOTE — Anesthesia Postprocedure Evaluation (Signed)
Anesthesia Post Note  Patient: Kellie Ross  Procedure(s) Performed: AN AD HOC LABOR EPIDURAL     Patient location during evaluation: Mother Baby Anesthesia Type: Epidural Level of consciousness: awake and alert Pain management: pain level controlled Vital Signs Assessment: post-procedure vital signs reviewed and stable Respiratory status: spontaneous breathing Cardiovascular status: stable Postop Assessment: no headache, adequate PO intake, no backache, patient able to bend at knees, able to ambulate, epidural receding and no apparent nausea or vomiting Anesthetic complications: no   No complications documented.  Last Vitals:  Vitals:   01/05/20 0700 01/05/20 0732  BP:  (!) 146/84  Pulse:  76  Resp: 17 19  Temp:  36.4 C  SpO2:  99%    Last Pain:  Vitals:   01/05/20 0732  TempSrc: Oral  PainSc: 0-No pain   Pain Goal: Patients Stated Pain Goal: 4 (01/05/20 0151)                 Salome Arnt

## 2020-01-05 NOTE — Lactation Note (Signed)
This note was copied from a baby's chart. Lactation Consultation Note  Patient Name: Kellie Ross ZHYQM'V Date: 01/05/2020 Reason for consult: Follow-up assessment;Primapara;1st time breastfeeding;NICU baby;Late-preterm 34-36.6wks;Infant < 6lbs;Multiple gestation  LC in to visit with P1 Mom and FOB. Twin babies at 50 hrs old in the NICU.  Mom is feeling much better and just returned from the NICU where she got to hold her babies.  Mom agreeable to initiate pumping and hand expression.  Reviewed breast massage and hand expression.  Mom has what appears to be Insufficient glandular tissue of her left breast, which is much smaller (and always has been).  Right breast is larger with palpable glandular tissue.  Mom reports slight breast changes on right breast primarily.    Reviewed breast massage and hand expression providing colostrum containers to store the colostrum.  Unable to express any drops currently, but encouraged Mom to be hand's on before and after double pumping.  Assisted with first double pumping on initiation setting.  24 mm flanges appear to be the right size currently.   Reviewed disassembling pump parts, washing, rinsing and air drying in separate bin provided.  Mom denies questions at this point and will ask for assistance prn.  Encouraged STS with babies prn.   Interventions Interventions: Breast feeding basics reviewed;Skin to skin;Breast massage;Hand express;DEBP  Lactation Tools Discussed/Used Tools: Pump;Flanges Flange Size: 24 Breast pump type: Double-Electric Breast Pump WIC Program: No   Consult Status Consult Status: Follow-up Date: 01/06/20 Follow-up type: In-patient    [name] 01/05/2020, 11:48 AM

## 2020-01-05 NOTE — Plan of Care (Signed)
  Problem: Education: Goal: Knowledge of condition will improve Outcome: Progressing   

## 2020-01-05 NOTE — Progress Notes (Signed)
Post Partum Day 1 - twins/PreE w severe features Subjective: no complaints, up ad lib, tolerating PO and nl lochia, pain controlled.  Receiving pRBCs 2 units - symptomatic anemia last night.  Excellent diuresis  Objective: Blood pressure (!) 146/84, pulse 76, temperature 97.6 F (36.4 C), temperature source Oral, resp. rate 19, height 5\' 11"  (1.803 m), weight 129.3 kg, SpO2 99 %, unknown if currently breastfeeding.  Physical Exam:  General: alert and no distress Lochia: appropriate Uterine Fundus: firm   Recent Labs    01/04/20 2114 01/05/20 0551  HGB 8.8* 11.7*  HCT 27.1* 34.9*    Assessment/Plan: Breastfeeding and Lactation consult.  Babies doing well in NICU - RA, no IV D/c magnesium now, d/c foley.   CBC after transfusion Restart antihypertensives - Procardia 30    LOS: 7 days   Karima Carrell Bovard-Stuckert 01/05/2020, 8:12 AM

## 2020-01-05 NOTE — Progress Notes (Signed)
Patient screened out for psychosocial assessment since none of the following apply: °Psychosocial stressors documented in mother or baby's chart °Gestation less than 32 weeks °Code at delivery  °Infant with anomalies °Please contact the Clinical Social Worker if specific needs arise, by MOB's request, or if MOB scores greater than 9/yes to question 10 on [place] Postpartum Depression Screen. ° °Danyle Boening Boyd-Gilyard, MSW, LCSW °Clinical Social Work °[phone] °  °

## 2020-01-05 NOTE — Progress Notes (Signed)
Patient ID: Kellie Ross, female   DOB: August 17, 1990, 29 y.o.   MRN: [id number] BP noted to be increasing gradually.   Will start pt on procardia 30 XL for BP management

## 2020-01-06 LAB — TYPE AND SCREEN
ABO/RH(D): B POS
Antibody Screen: NEGATIVE
Unit division: 0
Unit division: 0

## 2020-01-06 LAB — CBC
HCT: 31.3 % — ABNORMAL LOW (ref 36.0–46.0)
Hemoglobin: 10 g/dL — ABNORMAL LOW (ref 12.0–15.0)
MCH: 29.9 pg (ref 26.0–34.0)
MCHC: 31.9 g/dL (ref 30.0–36.0)
MCV: 93.4 fL (ref 80.0–100.0)
Platelets: 142 10*3/uL — ABNORMAL LOW (ref 150–400)
RBC: 3.35 MIL/uL — ABNORMAL LOW (ref 3.87–5.11)
RDW: 21.5 % — ABNORMAL HIGH (ref 11.5–15.5)
WBC: 10.3 10*3/uL (ref 4.0–10.5)
nRBC: 0 % (ref 0.0–0.2)

## 2020-01-06 LAB — BPAM RBC
Blood Product Expiration Date: 202109142359
Blood Product Expiration Date: 202109202359
ISSUE DATE / TIME: 202109072134
ISSUE DATE / TIME: 202109080145
Unit Type and Rh: 7300
Unit Type and Rh: 7300

## 2020-01-06 LAB — SURGICAL PATHOLOGY

## 2020-01-06 NOTE — Progress Notes (Signed)
Patient seen for afternoon evaluation. Denies HA, SOB, CP, visual changes. Edema slowly improving esp after ambulation. Kellie Ross tolerating hi caloria feeds and Kellie Ross doing donor milk, both on room air!   BP (!) 144/79 (BP Location: Right Arm)   Pulse 77   Temp 98.4 F (36.9 C) (Oral)   Resp 18   Ht 5\' 11"  (1.803 m)   Wt 129.3 kg   SpO2 98%   Breastfeeding Unknown   BMI 39.75 kg/m  Gen: sitting up in chair, NAD CV: CTAB, RRR Abdomen: +BS, soft, nontender. Dependent edema in lower abdomen and striae appears to be resolving Uterine Fundus: firm, 3cm below umbilicus as wlel as marked site on abdomen GU: suture intact, healing well, no purulent drainage Extremities: 1-2+ edema (pedal 2+, calf 1+), neg calf TTP BL, neg Homans BL  BP 140s/90s today, pt asymptomatic. Will continue to monitor overnight and plan for discharge PPD#3 morning. All questions answered

## 2020-01-06 NOTE — Progress Notes (Signed)
POSTPARTUM PROGRESS NOTE  Post Partum Day #2  Subjective:  No acute events overnight.  Pt denies problems with ambulating, voiding or po intake.  Still with moderate fatigue but is slowly improving from yesterday. She denies nausea or vomiting.  Pain is well controlled.  She has had flatus. She has not had bowel movement.  Lochia Minimal. Wearing SCDs and feels she still has LE edema but improved from yesterday. Pumping for baby girls in NICU who are doing well. Denies other PreE symptoms, currently on Proc 30XL  Objective: Blood pressure (!) 143/82, pulse 61, temperature 98.6 F (37 C), temperature source Oral, resp. rate 18, height 5\' 11"  (1.803 m), weight 129.3 kg, SpO2 97 %, unknown if currently breastfeeding.  Physical Exam:  General: alert, cooperative and no distress Lochia:normal flow Chest: CTAB Heart: RRR no m/r/g Abdomen: +BS, soft, nontender. Dependent edema in lower abdomen and striae appears to be resolving Uterine Fundus: firm, 3cm below umbilicus as wlel as marked site on abdomen GU: suture intact, healing well, no purulent drainage Extremities: 1-2+ edema (pedal 2+, calf 1+), neg calf TTP BL, neg Homans BL. SCDs in place  Recent Labs    01/05/20 0551 01/06/20 0540  HGB 11.7* 10.0*  HCT 34.9* 31.3*    Assessment/Plan:  ASSESSMENT: Kellie Ross is a 29 y.o. G1P0102 s/p SVD of TIUP (G/G) @ [redacted]w[redacted]d. PNC c/b PreE s/ SF, s/p MGSO4/BMTZ and now on Procardia 30XL.    1) PreE w/ SF -S/p MgSO4, now on Proc 30XL -BP 135-149/82-96, overall asymptomatic with LE edema resolving    *Continue meds, will need BP check 1wk after discharge  2) Postpartum, Preterm -Breast feeding - continue lactation support, reinforced hydration -Encouraged ambulation today as pt still slow to transfer between bed and chair -Mild anemia - Hgb 10 s/p 2u pRBC, continue PO iron supplementation  Trend BP in-house today plan on DC PPD#3. Will re-eval in afternoon   LOS: 8 days

## 2020-01-06 NOTE — Lactation Note (Signed)
This note was copied from a baby's chart. Lactation Consultation Note  Patient Name: Kellie Ross [id number]'[id number] Date: 01/06/2020 Reason for consult: Follow-up assessment;Primapara;1st time breastfeeding;NICU baby;Late-preterm 34-36.6wks;Multiple gestation;Infant < 6lbs  0933 - 0957 - I followed up with Ms. Wanat, She has been using her DEBP consistently. She has sore nipples, and I noted red/bruising at the base of her nipples. She was using a size 24 flange.  I provided coconut oil and sized her with a 27 flange. I observed her pump to make sure this was the correct fit.  While pumping, I reviewed pumping frequency and recommended that after she visits her babies, that she pump. I encouraged her to take her pump equipment to the room with her.  Ms. Winborne states that she is getting droplets of colostrum when pumping. She and her support person have the NICU booklet, and they are aware this is normal for day 2. We reviewed when to expect milk volume to increase. Her support person was actively involved (attentive) during this consult.  I encouraged Ms. Kroening to continue to pump every 2-3 hours during the day and every 3-4 hours at night.  Ms. Skelton has asymmetrical breasts. She noted positive breast changes during pregnancy. She has a Medela Pump In Style at home.   Maternal Data Has patient been taught Hand Expression?: Yes  Feeding Feeding Type: Formula  Interventions Interventions: Breast feeding basics reviewed;Hand express;DEBP;Coconut oil  Lactation Tools Discussed/Used Tools: Pump;Coconut oil;Flanges Flange Size: 27 Breast pump type: Double-Electric Breast Pump Pump Review: Setup, frequency, and cleaning   Consult Status Consult Status: Follow-up Date: 01/07/20 Follow-up type: In-patient    Walker Shadow 01/06/2020, 10:40 AM

## 2020-01-07 ENCOUNTER — Ambulatory Visit: Payer: Self-pay

## 2020-01-07 MED ORDER — NIFEDIPINE ER OSMOTIC RELEASE 30 MG PO TB24
60.0000 mg | ORAL_TABLET | Freq: Every day | ORAL | Status: DC
  Administered 2020-01-07: 60 mg via ORAL
  Filled 2020-01-07 (×2): qty 2

## 2020-01-07 MED ORDER — NIFEDIPINE ER 60 MG PO TB24: 60.0000 mg | ORAL_TABLET | Freq: Every day | ORAL | 2 refills | Status: DC

## 2020-01-07 MED ORDER — IBUPROFEN 600 MG PO TABS: 600.0000 mg | ORAL_TABLET | Freq: Four times a day (QID) | ORAL | 0 refills | Status: DC

## 2020-01-07 NOTE — Lactation Note (Signed)
This note was copied from a baby's chart. Lactation Consultation Note  Patient Name: Kellie Ross CVUDT'H Date: 01/07/2020   telephone call from RN that mom needs some larger flanges.  Mom reports she has been using 24 at home at 27 here. Mom has lots of swelling in her feet and legs at this time.  Did not observe a pumping.  Gave mom 30 flanges.  Reviewed appropriate flange fit.  Discussed reverse pressure softening and massage and hand expression to help the milk flow.  Urged mom to call to have pumping observed.  Urged to call lactation as needed.    Maternal Data    Feeding Feeding Type: Formula  LATCH Score                   Interventions    Lactation Tools Discussed/Used     Consult Status      Levii Hairfield Michaelle Copas 01/07/2020, 9:06 PM

## 2020-01-07 NOTE — Lactation Note (Signed)
This note was copied from a baby's chart. Lactation Consultation Note  Patient Name: Kellie Ross [id number]'N Date: 01/07/2020 Reason for consult: Follow-up assessment;1st time breastfeeding;NICU baby;Primapara;Late-preterm 34-36.6wks;Multiple gestation;Infant < 6lbs  LC in to visit with P1 Mom and FOB of LPT twins in the NICU.  Babies are 34 hrs old and Mom being discharged today.    Mom pumping using the Medela Symphony in babies's room, colostrum noted on right side, and drops on left.  Mom has been diligent with her pumping.  SLP in to assess twin B "[name]" for bottle feeding.  Asked if baby could go to breast first if Mom would like.  Offered assist.   Baby placed STS in football hold on right breast.  Demonstrated hand expression to help entice baby to latch.  Assisted Mom to support her breast and hold baby's head from ear to ear.  Baby opened and latched for a total of 10 mins, with occasional swallows identified.  Mom denied pinching discomfort.  Showed Mom how to use alternate breast compression to increase milk transfer.  Baby rhythmic with her sucking and pauses.  Baby maintain depth on the breast.  Reviewed basics and assured Mom that baby was doing extremely well for [redacted]w[redacted]d.    Encouraged Mom to double pump after each breastfeeding due to prematurity.  Mom has a Medela PIS at home.  Mom encouraged to continue with breast massage and hand expression.   Mom to be going home today and get some sleep.  Mom to pump once she is home and then start up her routine once awake.  Mom states she hasn't slept much at all since babies were born.  Feeding Feeding Type: Breast Fed  LATCH Score Latch: Grasps breast easily, tongue down, lips flanged, rhythmical sucking.  Audible Swallowing: A few with stimulation  Type of Nipple: Everted at rest and after stimulation  Comfort (Breast/Nipple): Soft / non-tender  Hold (Positioning): Assistance needed to correctly position infant at  breast and maintain latch.  LATCH Score: 8  Interventions Interventions: Breast feeding basics reviewed;Assisted with latch;Skin to skin;Breast massage;Hand express;Breast compression;Adjust position;Support pillows;Position options;Coconut oil;DEBP  Lactation Tools Discussed/Used Tools: Pump;Flanges;Coconut oil Flange Size: 27 Breast pump type: Double-Electric Breast Pump   Consult Status Consult Status: Follow-up Date: 01/10/20 Follow-up type: In-patient    [name] 01/07/2020, 11:46 AM

## 2020-01-07 NOTE — Progress Notes (Signed)
Discharge instructions and prescriptions given to pt. Discussed post vaginal delivery care, signs and symptoms to report to the MD, upcoming appointments, and meds. Pt verbalizes understanding and has no questions or concerns at this time. Pt discharged from hospital in stable condition. 

## 2020-01-07 NOTE — Progress Notes (Signed)
PPD #3  Doing well, feels fine, babies doing well Afeb, VSS, BP 130-150/80-100 Fundus firm  Will increase procardia XL from 30 to 60 mg, d/c home today

## 2020-01-07 NOTE — Discharge Instructions (Signed)
Postpartum Care After Vaginal Delivery This sheet gives you information about how to care for yourself from the time you deliver your baby to up to 6-12 weeks after delivery (postpartum period). Your health care provider may also give you more specific instructions. If you have problems or questions, contact your health care provider. Follow these instructions at home: Vaginal bleeding  It is normal to have vaginal bleeding (lochia) after delivery. Wear a sanitary pad for vaginal bleeding and discharge. ? During the first week after delivery, the amount and appearance of lochia is often similar to a menstrual period. ? Over the next few weeks, it will gradually decrease to a dry, yellow-brown discharge. ? For most women, lochia stops completely by 4-6 weeks after delivery. Vaginal bleeding can vary from woman to woman.  Change your sanitary pads frequently. Watch for any changes in your flow, such as: ? A sudden increase in volume. ? A change in color. ? Large blood clots.  If you pass a blood clot from your vagina, save it and call your health care provider to discuss. Do not flush blood clots down the toilet before talking with your health care provider.  Do not use tampons or douches until your health care provider says this is safe.  If you are not breastfeeding, your period should return 6-8 weeks after delivery. If you are feeding your child breast milk only (exclusive breastfeeding), your period may not return until you stop breastfeeding. Perineal care  Keep the area between the vagina and the anus (perineum) clean and dry as told by your health care provider. Use medicated pads and pain-relieving sprays and creams as directed.  If you had a cut in the perineum (episiotomy) or a tear in the vagina, check the area for signs of infection until you are healed. Check for: ? More redness, swelling, or pain. ? Fluid or blood coming from the cut or tear. ? Warmth. ? Pus or a bad  smell.  You may be given a squirt bottle to use instead of wiping to clean the perineum area after you go to the bathroom. As you start healing, you may use the squirt bottle before wiping yourself. Make sure to wipe gently.  To relieve pain caused by an episiotomy, a tear in the vagina, or swollen veins in the anus (hemorrhoids), try taking a warm sitz bath 2-3 times a day. A sitz bath is a warm water bath that is taken while you are sitting down. The water should only come up to your hips and should cover your buttocks. Breast care  Within the first few days after delivery, your breasts may feel heavy, full, and uncomfortable (breast engorgement). Milk may also leak from your breasts. Your health care provider can suggest ways to help relieve the discomfort. Breast engorgement should go away within a few days.  If you are breastfeeding: ? Wear a bra that supports your breasts and fits you well. ? Keep your nipples clean and dry. Apply creams and ointments as told by your health care provider. ? You may need to use breast pads to absorb milk that leaks from your breasts. ? You may have uterine contractions every time you breastfeed for up to several weeks after delivery. Uterine contractions help your uterus return to its normal size. ? If you have any problems with breastfeeding, work with your health care provider or lactation consultant.  If you are not breastfeeding: ? Avoid touching your breasts a lot. Doing this can make   your breasts produce more milk. ? Wear a good-fitting bra and use cold packs to help with swelling. ? Do not squeeze out (express) milk. This causes you to make more milk. Intimacy and sexuality  Ask your health care provider when you can engage in sexual activity. This may depend on: ? Your risk of infection. ? How fast you are healing. ? Your comfort and desire to engage in sexual activity.  You are able to get pregnant after delivery, even if you have not had  your period. If desired, talk with your health care provider about methods of birth control (contraception). Medicines  Take over-the-counter and prescription medicines only as told by your health care provider.  If you were prescribed an antibiotic medicine, take it as told by your health care provider. Do not stop taking the antibiotic even if you start to feel better. Activity  Gradually return to your normal activities as told by your health care provider. Ask your health care provider what activities are safe for you.  Rest as much as possible. Try to rest or take a nap while your baby is sleeping. Eating and drinking   Drink enough fluid to keep your urine pale yellow.  Eat high-fiber foods every day. These may help prevent or relieve constipation. High-fiber foods include: ? Whole grain cereals and breads. ? Brown rice. ? Beans. ? Fresh fruits and vegetables.  Do not try to lose weight quickly by cutting back on calories.  Take your prenatal vitamins until your postpartum checkup or until your health care provider tells you it is okay to stop. Lifestyle  Do not use any products that contain nicotine or tobacco, such as cigarettes and e-cigarettes. If you need help quitting, ask your health care provider.  Do not drink alcohol, especially if you are breastfeeding. General instructions  Keep all follow-up visits for you and your baby as told by your health care provider. Most women visit their health care provider for a postpartum checkup within the first 3-6 weeks after delivery. Contact a health care provider if:  You feel unable to cope with the changes that your child brings to your life, and these feelings do not go away.  You feel unusually sad or worried.  Your breasts become red, painful, or hard.  You have a fever.  You have trouble holding urine or keeping urine from leaking.  You have little or no interest in activities you used to enjoy.  You have not  breastfed at all and you have not had a menstrual period for 12 weeks after delivery.  You have stopped breastfeeding and you have not had a menstrual period for 12 weeks after you stopped breastfeeding.  You have questions about caring for yourself or your baby.  You pass a blood clot from your vagina. Get help right away if:  You have chest pain.  You have difficulty breathing.  You have sudden, severe leg pain.  You have severe pain or cramping in your lower abdomen.  You bleed from your vagina so much that you fill more than one sanitary pad in one hour. Bleeding should not be heavier than your heaviest period.  You develop a severe headache.  You faint.  You have blurred vision or spots in your vision.  You have bad-smelling vaginal discharge.  You have thoughts about hurting yourself or your baby. If you ever feel like you may hurt yourself or others, or have thoughts about taking your own life, get help  right away. You can go to the nearest emergency department or call:  Your local emergency services (911 in the [place]  A suicide crisis helpline, such as the National Suicide Prevention Lifeline at 916-553-9421. This is open 24 hours a day. Summary  The period of time right after you deliver your newborn up to 6-12 weeks after delivery is called the postpartum period.  Gradually return to your normal activities as told by your health care provider.  Keep all follow-up visits for you and your baby as told by your health care provider. This information is not intended to replace advice given to you by your health care provider. Make sure you discuss any questions you have with your health care provider. Document Revised: 04/18/2017 Document Reviewed: 01/27/2017 Elsevier Patient Education  2020 [employer] Preeclampsia and Eclampsia Preeclampsia is a serious condition that may develop during pregnancy. This condition causes high blood pressure and increased protein  in your urine along with other symptoms, such as headaches and vision changes. These symptoms may develop as the condition gets worse. Preeclampsia may occur at 20 weeks of pregnancy or later. Diagnosing and treating preeclampsia early is very important. If not treated early, it can cause serious problems for you and your baby. One problem it can lead to is eclampsia. Eclampsia is a condition that causes muscle jerking or shaking (convulsions or seizures) and other serious problems for the mother. During pregnancy, delivering your baby may be the best treatment for preeclampsia or eclampsia. For most women, preeclampsia and eclampsia symptoms go away after giving birth. In rare cases, a woman may develop preeclampsia after giving birth (postpartum preeclampsia). This usually occurs within 48 hours after childbirth but may occur up to 6 weeks after giving birth. What are the causes? The cause of preeclampsia is not known. What increases the risk? The following risk factors make you more likely to develop preeclampsia:  Being pregnant for the first time.  Having had preeclampsia during a past pregnancy.  Having a family history of preeclampsia.  Having high blood pressure.  Being pregnant with more than one baby.  Being 69 or older.  Being African-American.  Having kidney disease or diabetes.  Having medical conditions such as lupus or blood diseases.  Being very overweight (obese). What are the signs or symptoms? The most common symptoms are:  Severe headaches.  Vision problems, such as blurred or double vision.  Abdominal pain, especially upper abdominal pain. Other symptoms that may develop as the condition gets worse include:  Sudden weight gain.  Sudden swelling of the hands, face, legs, and feet.  Severe nausea and vomiting.  Numbness in the face, arms, legs, and feet.  Dizziness.  Urinating less than usual.  Slurred speech.  Convulsions or seizures. How is  this diagnosed? There are no screening tests for preeclampsia. Your health care provider will ask you about symptoms and check for signs of preeclampsia during your prenatal visits. You may also have tests that include:  Checking your blood pressure.  Urine tests to check for protein. Your health care provider will check for this at every prenatal visit.  Blood tests.  Monitoring your baby's heart rate.  Ultrasound. How is this treated? You and your health care provider will determine the treatment approach that is best for you. Treatment may include:  Having more frequent prenatal exams to check for signs of preeclampsia, if you have an increased risk for preeclampsia.  Medicine to lower your blood pressure.  Staying in the  hospital, if your condition is severe. There, treatment will focus on controlling your blood pressure and the amount of fluids in your body (fluid retention).  Taking medicine (magnesium sulfate) to prevent seizures. This may be given as an injection or through an IV.  Taking a low-dose aspirin during your pregnancy.  Delivering your baby early. You may have your labor started with medicine (induced), or you may have a cesarean delivery. Follow these instructions at home: Eating and drinking   Drink enough fluid to keep your urine pale yellow.  Avoid caffeine. Lifestyle  Do not use any products that contain nicotine or tobacco, such as cigarettes and e-cigarettes. If you need help quitting, ask your health care provider.  Do not use alcohol or drugs.  Avoid stress as much as possible. Rest and get plenty of sleep. General instructions  Take over-the-counter and prescription medicines only as told by your health care provider.  When lying down, lie on your left side. This keeps pressure off your major blood vessels.  When sitting or lying down, raise (elevate) your feet. Try putting some pillows underneath your lower legs.  Exercise regularly. Ask  your health care provider what kinds of exercise are best for you.  Keep all follow-up and prenatal visits as told by your health care provider. This is important. How is this prevented? There is no known way of preventing preeclampsia or eclampsia from developing. However, to lower your risk of complications and detect problems early:  Get regular prenatal care. Your health care provider may be able to diagnose and treat the condition early.  Maintain a healthy weight. Ask your health care provider for help managing weight gain during pregnancy.  Work with your health care provider to manage any long-term (chronic) health conditions you have, such as diabetes or kidney problems.  You may have tests of your blood pressure and kidney function after giving birth.  Your health care provider may have you take low-dose aspirin during your next pregnancy. Contact a health care provider if:  You have symptoms that your health care provider told you may require more treatment or monitoring, such as: ? Headaches. ? Nausea or vomiting. ? Abdominal pain. ? Dizziness. ? Light-headedness. Get help right away if:  You have severe: ? Abdominal pain. ? Headaches that do not get better. ? Dizziness. ? Vision problems. ? Confusion. ? Nausea or vomiting.  You have any of the following: ? A seizure. ? Sudden, rapid weight gain. ? Sudden swelling in your hands, ankles, or face. ? Trouble moving any part of your body. ? Numbness in any part of your body. ? Trouble speaking. ? Abnormal bleeding.  You faint. Summary  Preeclampsia is a serious condition that may develop during pregnancy.  This condition causes high blood pressure and increased protein in your urine along with other symptoms, such as headaches and vision changes.  Diagnosing and treating preeclampsia early is very important. If not treated early, it can cause serious problems for you and your baby.  Get help right away if  you have symptoms that your health care provider told you to watch for. This information is not intended to replace advice given to you by your health care provider. Make sure you discuss any questions you have with your health care provider. Document Revised: 12/16/2017 Document Reviewed: 11/20/2015 Elsevier Patient Education  [employer]. As per discharge pamphlet

## 2020-01-08 NOTE — Discharge Summary (Signed)
Postpartum Discharge Summary      Patient Name: Kellie Ross DOB: Apr 21, 1991 MRN: [id number]  Date of admission: 12/29/2019 Delivery date:   Deazia, Lampi [[id number]]  01/04/2020    Bowen, Goyal [[id number]]  01/04/2020   Delivering provider:    Karle Plumber [[id number]]  [place], CECILIA WOREMA    Kobi, Aller Tacia [[id number]]  [facility]   Date of discharge: 01/07/2020  Admitting diagnosis: Preeclampsia, third trimester [O14.93] Intrauterine pregnancy: [redacted]w[redacted]d     Secondary diagnosis:  Active Problems:   Dichorionic diamniotic twin pregnancy   Gestational hypertension affecting first pregnancy   Preeclampsia, third trimester   Twin pregnancy delivered vaginally      Discharge diagnosis: Preterm Pregnancy Delivered, Preeclampsia (severe), Anemia, PPH and di/di twins                                              Post partum procedures:blood transfusion   Hospital course: Onset of Labor With Vaginal Delivery      29 y.o. yo G1P0102 at [redacted]w[redacted]d was admitted on 12/29/2019 at 34 weeks with di/di twins, preeclampsia with some severe range BP.  She was admitted, given betamethasone for fetal pulmonary maturation, given IV labetalol and started on PO labetalol to control her BP.  She remained stable on this regimen for several days with BP controlled, no symptoms and normal labs.  Dr. Senaida Ores consulted with MFM on 9-5, PO labetalol was stopped.  On 9-6 she developed persistent severe range BP so induction was initiated with cervical ripening with cytotec and she was placed on magnesium for seizure prophylaxis. Patient had an uncomplicated labor course as follows:  Membrane Rupture Time/Date:    Suheyla, Mortellaro [[id number]]  9:06 AM    Dilan, Fullenwider [[id number]]  2:44 PM  ,   Sydnie, Sigmund [[id number]]  01/04/2020    Jonice, Cerra [[id number]]  01/04/2020    Delivery Method:   Karle Plumber [[id number]]  Vaginal,  Spontaneous    Rhylie, Stehr Johnay [[id number]]  Vaginal, Spontaneous   Episiotomy:    Jocilynn, Grade [[id number]]  None    Rekisha, Welling Delora [[id number]]  None   Lacerations:     Tamme, Mozingo [[id number]]  2nd degree    Klaudia, Beirne [[id number]]  2nd degree   Postpartum she was kept on magnesium for 24 hours, Procardia XL 30 mg initially and then 60 mg to control BP.  She had symptomatic anemia and was transfused 2 units PRBC.  She is ambulating, tolerating a regular diet, passing flatus, and urinating well. Patient is discharged home in stable condition on 01/07/2020  Newborn Data: Birth date:   Vicenta, Olds [[id number]]  01/04/2020    Kristena, Wilhelmi [[id number]]  01/04/2020   Birth time:   Rosaleigh, Brazzel [[id number]]  2:41 PM    Tyarra, Nolton [[id number]]  2:47 PM   Gender:   Alixis, Harmon [[id number]]  Female    Maiana, Hennigan [[id number]]  Female   Living status:   Makiya, Jeune [[id number]]  Living    Fletcher, Rathbun [[id number]]  Living   Apgars:   Pakou, Rainbow [[id number]]  [address] [place]oz[place] [[id number]]  8  ,   Hanaan, Gancarz [[id number]]  [address] [[id number]]  9   Weight:   Rebbecca, Osuna [[id number]]  2420 g    Aqsa, Sensabaugh Gailya [[id number]]  2600 g    Magnesium Sulfate received: Yes: Seizure prophylaxis BMZ received: Yes Transfusion:Yes  Physical exam  Vitals:   01/06/20 2106 01/06/20 2354 01/07/20 0354 01/07/20 0749  BP: 135/90 (!) 142/91 (!) 153/81 (!) 139/100  Pulse: 92 91 (!) 58 76  Resp: 17 18 18 20   Temp: 98.5 F (36.9 C) 98.3 F (36.8 C) 97.9 F (36.6 C) 97.9 F (36.6 C)  TempSrc: Oral Oral Oral Oral  SpO2: 100% 100% 98% 100%  Weight:      Height:       General: alert Lochia: appropriate Uterine Fundus: firm  Labs: Lab Results  Component Value Date   WBC 10.3 01/06/2020   HGB 10.0 (L)  01/06/2020   HCT 31.3 (L) 01/06/2020   MCV 93.4 01/06/2020   PLT 142 (L) 01/06/2020   CMP Latest Ref Rng & Units 01/04/2020  Glucose 70 - 99 mg/dL 82  BUN 6 - 20 mg/dL 11  Creatinine 03/05/2020 - 3.56 mg/dL 7.01  Sodium 4.10 - 301 mmol/L 132(L)  Potassium 3.5 - 5.1 mmol/L 4.1  Chloride 98 - 111 mmol/L 102  CO2 22 - 32 mmol/L 21(L)  Calcium 8.9 - 10.3 mg/dL 314)  Total Protein 6.5 - 8.1 g/dL 5.3(L)  Total Bilirubin 0.3 - 1.2 mg/dL 0.7  Alkaline Phos 38 - 126 U/L 187(H)  AST 15 - 41 U/L 46(H)  ALT 0 - 44 U/L 20   Edinburgh Score: Edinburgh Postnatal Depression Scale Screening Tool 01/06/2020  I have been able to laugh and see the funny side of things. 0  I have looked forward with enjoyment to things. 0  I have blamed myself unnecessarily when things went wrong. 0  I have been anxious or worried for no good reason. 0  I have felt scared or panicky for no good reason. 0  Things have been getting on top of me. 0  I have been so unhappy that I have had difficulty sleeping. 0  I have felt sad or miserable. 0  I have been so unhappy that I have been crying. 0  The thought of harming myself has occurred to me. 0  Edinburgh Postnatal Depression Scale Total 0      After visit meds:  Allergies as of 01/07/2020   No Known Allergies     Medication List    TAKE these medications   FeroSul 325 (65 FE) MG tablet Generic drug: ferrous sulfate Take 325 mg by mouth 2 (two) times daily.   ibuprofen 600 MG tablet Commonly known as: ADVIL Take 1 tablet (600 mg total) by mouth every 6 (six) hours.   NIFEdipine 60 MG 24 hr tablet Commonly known as: ADALAT CC Take 1 tablet (60 mg total) by mouth daily.   Stool Softener 100 MG capsule Generic drug: docusate sodium Take 100 mg by mouth 2 (two) times daily.        Discharge home in stable condition Infant Feeding: Breast Infant Disposition:NICU Discharge instruction: per After Visit Summary and Postpartum booklet. Activity: Advance as  tolerated. Pelvic rest for 6 weeks.  Diet: routine diet  Postpartum Appointment:1 week Follow up Visit:  Follow-up Information    03/08/2020, DO. Schedule an appointment as soon as possible for a visit in 4 day(s).   Specialty: Obstetrics and Gynecology Contact information: [address] [place] [address] [place] [phone]  01/08/2020 [name], MD

## 2020-01-09 ENCOUNTER — Ambulatory Visit: Payer: Self-pay

## 2020-01-09 NOTE — Lactation Note (Signed)
This note was copied from a baby's chart. Lactation Consultation Note  Patient Name: Kellie Ross [id number] Date: 01/09/2020   Attempted to visit with mom, but she wasn't in the room. NICU RN Henderson Newcomer reported to [facility] that mom already came to visit for the day, she usually stops by between 7-9 am. Let NICU RN know to page lactation when mom comes back to make sure she doesn't have any questions or concerns regarding her flange size. Mom had her flanged resized from a # 27 to a # 30 two days ago, but lactation did not observe a pumping session.  Called mom over the phone and she reported that she's no longer using the # 27 flanges, but the # 30 now and they seem to work better. She doesn't have any further questions or concerns at this point. Reminded her that lactation can observe a pumping session if needed, and how important is to emptied the breast to protect her supply. Mom will call lactation PRN through the twins NICU RN.   Maternal Data    Feeding Feeding Type: Formula  LATCH Score                   Interventions    Lactation Tools Discussed/Used     Consult Status      Kellie Ross Kellie Ross 01/09/2020, 6:13 PM

## 2020-01-10 ENCOUNTER — Ambulatory Visit: Payer: Self-pay

## 2020-01-10 ENCOUNTER — Ambulatory Visit: Payer: BC Managed Care – PPO

## 2020-01-10 NOTE — Lactation Note (Signed)
This note was copied from a baby's chart. Lactation Consultation Note  Patient Name: Kellie Ross [id number] Date: 01/10/2020 Reason for consult: Follow-up assessment;Primapara;1st time breastfeeding;NICU baby;Multiple gestation;Late-preterm 34-36.6wks;Infant < 6lbs  LC in to visit with Mom in NICU.  Babies are 90 days old.  Babies are sleeping in open cribs being gavage fed.  Mom double pumping.  Mom is expressing more milk and is almost able to fill up a colostrum container.  Mom is expressing more milk from right breast.    Talked to Mom about latching babies to the breast.  Mom has done this with each baby.  Offered to assist if babies show feeding cues, and to ask the RN to call LC.  Mom is interested.  Encouraged STS with babies as much as possible.   Interventions Interventions: Breast feeding basics reviewed;Skin to skin;Breast massage;Hand express;DEBP  Lactation Tools Discussed/Used Tools: Pump Breast pump type: Double-Electric Breast Pump   Consult Status Consult Status: Follow-up Date: 01/11/20 Follow-up type: In-patient    Kellie Ross 01/10/2020, 3:46 PM

## 2020-01-17 ENCOUNTER — Ambulatory Visit: Payer: BC Managed Care – PPO

## 2020-01-17 ENCOUNTER — Ambulatory Visit: Payer: Self-pay

## 2020-01-17 NOTE — Lactation Note (Signed)
This note was copied from a baby's chart. Lactation Consultation Note  Patient Name: Kellie Ross ZOXWR'U Date: 01/17/2020 Reason for consult: Follow-up assessment;1st time breastfeeding;Late-preterm 34-36.6wks;Multiple gestation;NICU baby  [phone] - I followed up with Kellie Ross today to check on her progress with breast feeding and pumping. She states that she's pumping low volumes (up to 8 mls combined). She is currently rooming in and using the Symphony DEBP.  I reviewed her pumping routine and reinforced some best practices. She currently rooms in at night, but she states that she is a sound sleeper, and she often sleeps through touch times. I recommended setting and alarm at night, and discussed the importance of nighttime pumping.  During the daytime, she pumps, but she is not currently tracking her pumping times. I recommended downloading a free breast pumping app to help her track. It's unclear if she is pumping frequently during the daytime.  Plan: Track pumping sessions for the next 3-4 days. Set a goal to pump 8 times a day. I recommended more frequent pumping by day if she is sleeping longer stretches at night. I recommended pumping just prior to her bedtime and to again pump around 0200-0300 each night. Reasons for this were discussed. I recommended power pumping for one hour each day this week.  I scheduled an LC to follow up on Friday 9/24 at 1400. This time will be used to practice latching one of the babies and to follow up on her progress with increasing her milk volume.  Kellie Ross was using a Symphony DEBP. I reviewed the settings on the pump and encouraged her to go ahead and switch to the maintenance setting. She was using the initiation setting. She notes that her milk appears to be mature milk.  Maternal Data Does the patient have breastfeeding experience prior to this delivery?: No  Feeding Feeding Type: Formula  Interventions Interventions: Breast feeding  basics reviewed;DEBP  Lactation Tools Discussed/Used Pump Review: Setup, frequency, and cleaning   Consult Status Consult Status: Follow-up Date: 01/21/20 Follow-up type: In-patient    Walker Shadow 01/17/2020, 11:54 AM

## 2020-01-21 ENCOUNTER — Ambulatory Visit: Payer: Self-pay

## 2020-01-21 NOTE — Lactation Note (Addendum)
This note was copied from a baby's chart. Lactation Consultation Note  Patient Name: Kellie Ross YIFOY'D Date: 01/21/2020 Reason for consult: Follow-up assessment;1st time breastfeeding;Primapara;Multiple gestation;NICU baby;Early term 32-38.6wks  Twin girls at [facility] [redacted]w[redacted]d and at 46 days old.  LC in to assist with breastfeeding. Baby [name] twin A being held.  Encouraged STS and assisted with positioning.   Mom has been double pumping approximately 6 times a day.  Mom stated she gets a total of 30 ml breast milk per day.  Talked to Mom about her dedication to pumping to provide whatever breast milk she can to her babies.  Talked about time at the breast being more than just milk intake.  Mom started to cry and shared how she really wanted to be able to breast feed.  Talked about concern regarding her left breast having insufficient glandular tissue.  Her right breast is fuller, but she isn't able to express more than 5-10 ml from breast.  Mom reported little breast changes in early pregnancy.   Mom wants to continue to pump and provide whatever milk she can.  Shared with Mom that within the first 6 weeks, her milk supply can increase with more frequent milk removal (pumping and feeding).  Mom asked about Fenugreek herb.  Shared with Mom to research Moringa as a galactogogue.   With a decreased milk supply, talked about trying supplementing AT the breast with SNS.  Mom states she was interested.  Explained that increasing nutritive sucking at the breast can also stimulate milk supply.    Baby A [name] positioned at the right breast, which is the fuller breast that produces more, and she was unable to sustain a deep latch as she was fussing and coming off.  Initiated a 5 fr feeding tube and syringe to determine if this would settle baby into a nutritive pattern of sucking and swallowing.  [name] took 7 ml formula and fed for 20 mins on the breast.  RN to supplement with gavage feed minus 7 ml.   Baby  B [name] positioned on left breast with 5 fr feeding tube, but baby wasn't awake enough to attain a latch at this feeding.  Encouraged Mom to place baby on her chest STS, RN to gavage entire feeding.   Showed Mom the Medela Supplementation System and she stated she would like to try this.  Will follow-up tomorrow 9/25 at 11 and/or 2pm.    Praised Mom for her dedication and commitment to her baby's health by providing her breast milk.    Interventions Interventions: Breast feeding basics reviewed;Assisted with latch;Skin to skin;Breast massage;Hand express;Adjust position;Support pillows;Position options;DEBP  Lactation Tools Discussed/Used Tools: 48F feeding tube / Syringe;Pump;Supplemental Nutrition System Breast pump type: Double-Electric Breast Pump   Consult Status Consult Status: Follow-up Date: 01/22/20 Follow-up type: In-patient    Kellie Ross 01/21/2020, 4:07 PM

## 2020-01-22 ENCOUNTER — Ambulatory Visit: Payer: Self-pay

## 2020-01-22 NOTE — Lactation Note (Signed)
This note was copied from a baby's chart. Lactation Consultation Note  Patient Name: Kellie Ross YIRSW'N Date: 01/22/2020 Reason for consult: Follow-up assessment;NICU baby;Early term 37-38.6wks;Multiple gestation  LC is to assist with [name] (baby A) breastfeeding with an SNS at the breast.    Put 50 ml 24 cal formula in SNS and assisted Mom with this.  Baby able to attain a deep latch to breast and fed for 23 mins with deep jaw extension and swallows.  10 ml of supplement taken.  Baby burped half way and re-latched, but after 23 mins baby came off breast.    Due to Mom's low milk supply (pumping 30 ml total per day) and suspected insufficient glandular tissue of left breast in particular, spoke with RN about gavage feeding amount minus what baby took from SNS.  RN spoke with NNP Porfirio Mylar and it was decided to gavage 43 ml.  Mom is encouraged to pump both breasts 15-30 mins after breastfeeding.  Mom instructed on washing of SNS.  Lactation appt made for 11 am  9/27 for follow-up    Feeding Feeding Type: Breast Milk with Formula added  LATCH Score Latch: Grasps breast easily, tongue down, lips flanged, rhythmical sucking.  Audible Swallowing: Spontaneous and intermittent (with SNS at the breast)  Type of Nipple: Everted at rest and after stimulation  Comfort (Breast/Nipple): Soft / non-tender  Hold (Positioning): Assistance needed to correctly position infant at breast and maintain latch.  LATCH Score: 9  Interventions Interventions: Breast feeding basics reviewed;Assisted with latch;Skin to skin;Breast compression;Adjust position;Support pillows;Position options;DEBP   Consult Status Consult Status: Follow-up Date: 01/24/20 Follow-up type: In-patient    [name] 01/22/2020, 1:01 PM

## 2020-01-24 ENCOUNTER — Ambulatory Visit: Payer: Self-pay

## 2020-01-24 NOTE — Lactation Note (Signed)
This note was copied from a baby's chart. Lactation Consultation Note  Patient Name: Kellie Ross IEPPI'R Date: 01/24/2020 Reason for consult: Follow-up assessment;Late-preterm 34-36.6wks;1st time breastfeeding;Primapara;Multiple gestation;NICU baby;Infant < 6lbs  P2 mother whose infant is now 82 days old.  These are LPTI girls born at 34+6 weeks with a CGA of 37+5 weeks weighing < 6 lbs and in the NICU.  Mother was interested in feeding using the SNS today.  She was interested in feeding both babies this way.    Baby A was the first one to feed.  Poured 60 mls of Similac 24 calorie formula into the SNS bottle.  Mother positioned baby at the right breast in the football hold.  Initially, baby latched but would fuss and push back off the breast.  After a few attempts she was able to stay latched.  She had a wide gape and flanged lips; occasional swallows noted.  Baby was quite sleepy after approximately 10 minutes and gentle stimulation required to coerce her to continue sucking.  At the end of 25 minutes she had consumed 5 mls of formula.  Baby swaddled and placed in bassinet.  RN gavaged the remaining formula.    Baby B was awake and alert.  Poured another 60 mls of Similac 24 into the SNS bottle for her.  This was her first time attempting to latch using the SNS.  After a few attempts she was able to latch, however, she got fussy and pushed back.  Repeated a couple more times with the same result.  Suggested we let her rest and not try any further today.  Mother agreeable.  She did not consume any formula from the SNS.  RN gavaged her volume.    Mother continues to pump and is still only able to obtain approximately 30 mls/24 hours.  RN stated that mother does not pump at night and prefers to sleep.  Mother aware to pump at least every three hours; night time included.  Emotional support provided and praised mother for her efforts.  Mother slightly teary about not being able to provide more  breast milk.  Mother will call as needed for further assistance.    Maternal Data Formula Feeding for Exclusion: No Has patient been taught Hand Expression?: Yes Does the patient have breastfeeding experience prior to this delivery?: No  Feeding Feeding Type: Formula  LATCH Score Latch: Repeated attempts needed to sustain latch, nipple held in mouth throughout feeding, stimulation needed to elicit sucking reflex.  Audible Swallowing: A few with stimulation  Type of Nipple: Everted at rest and after stimulation  Comfort (Breast/Nipple): Soft / non-tender  Hold (Positioning): Assistance needed to correctly position infant at breast and maintain latch.  LATCH Score: 7  Interventions Interventions: Assisted with latch;Breast feeding basics reviewed;Skin to skin;DEBP;Position options;Support pillows  Lactation Tools Discussed/Used Pump Review:  (No review needed)   Consult Status Consult Status: Follow-up Date: 01/25/20 Follow-up type: In-patient    Delanda Bulluck R Noora Locascio 01/24/2020, 1:08 PM

## 2020-01-24 NOTE — Lactation Note (Signed)
This note was copied from a baby's chart. Lactation Consultation Note  Patient Name: Jazaria Jarecki TDVVO'H Date: 01/24/2020 Reason for consult: Follow-up assessment   LC Follow Up Visit:  A consult was set up for 1100 today, however, when I arrived mother stated it should have been for 1200.  I will return at that time to assist mother.   Maternal Data    Feeding Feeding Type: Formula Nipple Type: Dr. Cline Crock  LATCH Score                   Interventions    Lactation Tools Discussed/Used     Consult Status Consult Status: Follow-up Date: 01/24/20 Follow-up type: In-patient    Ameriah Lint R Carvel Huskins 01/24/2020, 11:03 AM

## 2020-01-26 ENCOUNTER — Ambulatory Visit: Payer: Self-pay

## 2020-01-26 NOTE — Lactation Note (Signed)
This note was copied from a baby's chart. Lactation Consultation Note  Patient Name: Kellie Ross Date: 01/26/2020 Reason for consult: Follow-up assessment;Primapara;1st time breastfeeding;NICU baby;Multiple gestation;Early term 37-38.6wks  LC in to visit with P1 Mom of ET twins.  Mom is pumping using the DEBP and obtaining a total of about 30 ml per 24 hrs (about 5 ml per pumping).  Mom is very committed to continuing to pump even though her supply is minimal, it makes her feel better as she is doing all she can.  Mom is quick to cry when talking about it.  Empathized with Mom about her milk supply not meeting baby's needs, and the probability of her supply not increasing without babies latching and feeding on the breast at each feeding.  Mom has used the SNS twice only.  Mom not sure about using this again, to let her RN know whether she would like an assist from [facility] today.   Shared with Mom that it was important for her to take care of herself and enjoy her babies.  If she chooses to discontinue breast pumping, her choice would be 100% supported by lactation.  Mom aware of benefits of babies going to breast for "appetizer or desserts" and to love babies at the breast.    Mom desires working on bottle feeding progression at this point.    Encouraged STS with babies.  Mom aware of lactation support prn.  Interventions Interventions: Breast feeding basics reviewed;Skin to skin;Breast massage;Hand express;DEBP  Lactation Tools Discussed/Used Tools: Pump;Bottle Breast pump type: Double-Electric Breast Pump   Consult Status Consult Status: Follow-up Date: 02/01/20 Follow-up type: In-patient    Kellie Ross 01/26/2020, 10:27 AM

## 2020-02-02 ENCOUNTER — Ambulatory Visit: Payer: Self-pay

## 2020-02-02 NOTE — Lactation Note (Signed)
This note was copied from a baby's chart. Lactation Consultation Note  Patient Name: Kellie Ross [id number] Date: 02/02/2020 Reason for consult: Follow-up assessment;Multiple gestation;NICU baby  I followed up with Kellie Ross today. Her twins may be discharged today or this week pending their progress. I asked her if she had any questions or if she would like lactation services. She declined, and stated that she's "good." She met with C. Smith, IBCLC, last week and did not have follow up questions.  Kellie Ross had a DEBP set up in the room, and her RN mentioned that she was pumping today. Kellie Ross told me that they will follow up with a pediatrician in [place] upon discharge.    Consult Status Consult Status: Complete Date: 02/02/20 Follow-up type: Call as needed    Lenore Manner 02/02/2020, 1:59 PM

## 2020-03-27 ENCOUNTER — Encounter: Payer: Self-pay | Admitting: Nurse Practitioner

## 2020-04-13 ENCOUNTER — Ambulatory Visit: Payer: BC Managed Care – PPO | Admitting: Nurse Practitioner

## 2020-11-02 LAB — OB RESULTS CONSOLE RUBELLA ANTIBODY, IGM: Rubella: IMMUNE

## 2020-11-02 LAB — OB RESULTS CONSOLE RPR: RPR: NONREACTIVE

## 2020-11-02 LAB — OB RESULTS CONSOLE GC/CHLAMYDIA
Chlamydia: NEGATIVE
Gonorrhea: NEGATIVE

## 2020-11-02 LAB — HEPATITIS C ANTIBODY: HCV Ab: NEGATIVE

## 2020-11-02 LAB — OB RESULTS CONSOLE HEPATITIS B SURFACE ANTIGEN: Hepatitis B Surface Ag: NEGATIVE
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
## 2020-12-29 IMAGING — US US MFM OB LIMITED
1 series · 14 of 15 positions shown · non-contrast
Comparison: none

[Series 1: us mfm ob limited · 14 of 15 slices shown]
[im 1/15]
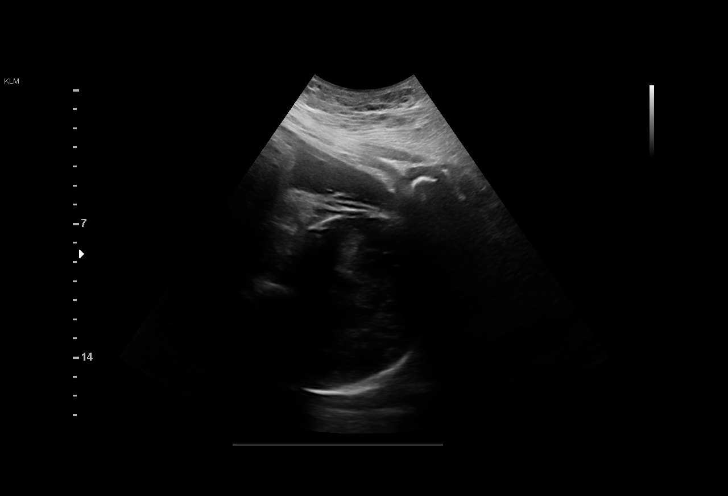
[im 2/15]
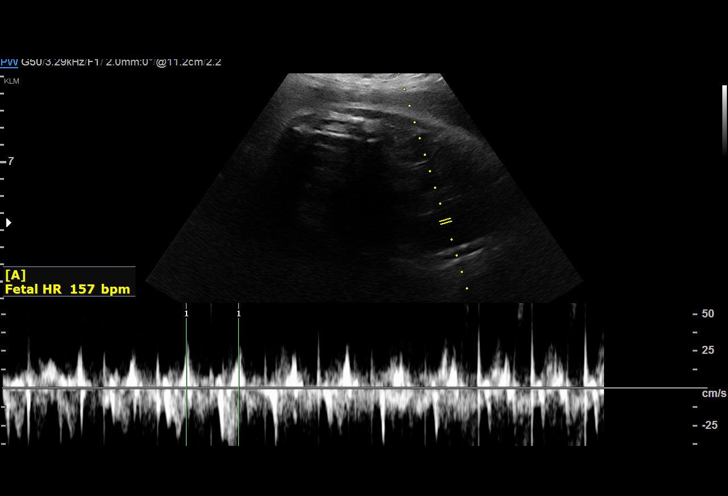
[im 3/15]
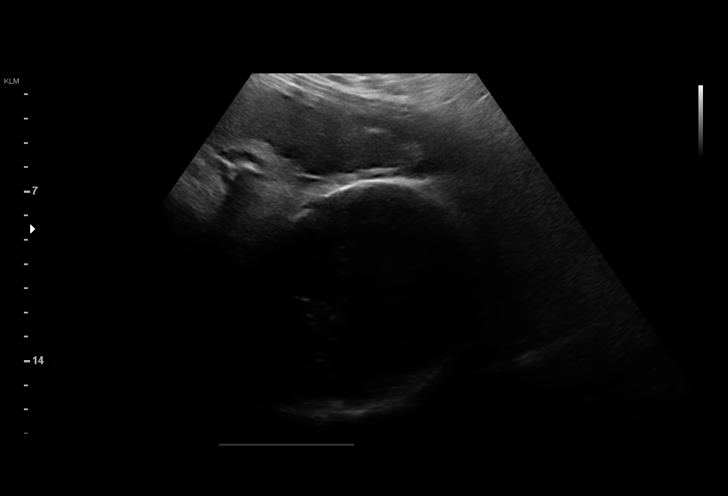
[im 4/15]
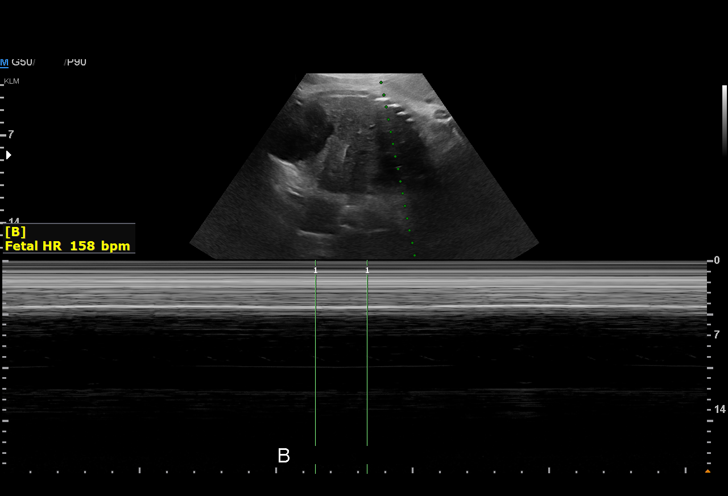
[im 5/15]
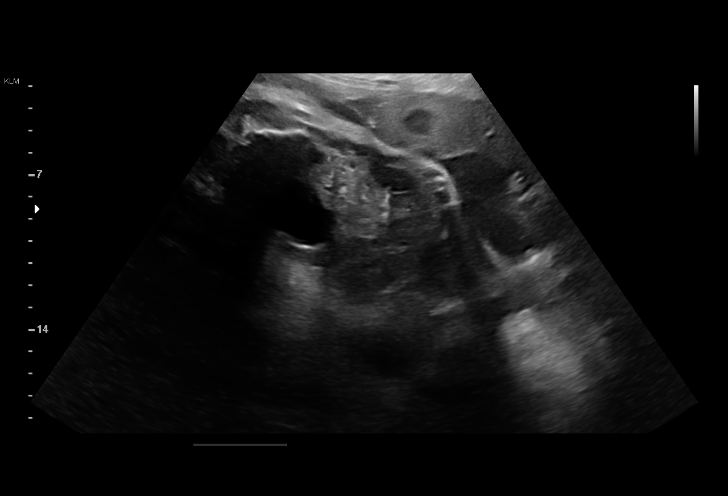
[im 6/15]
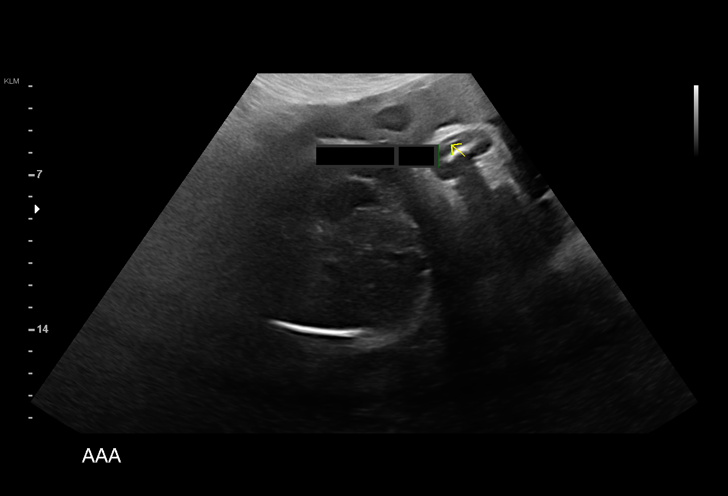
[im 7/15]
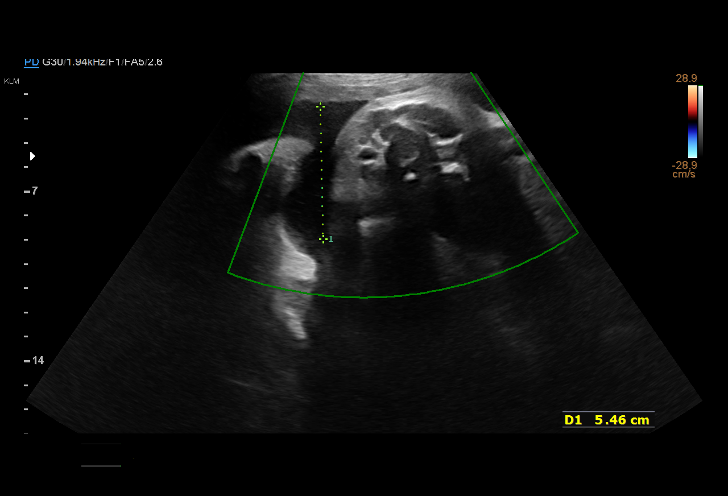
[im 9/15]
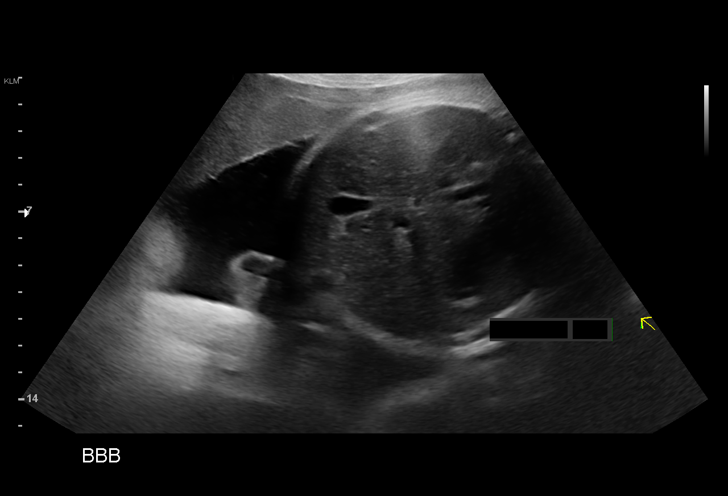
[im 10/15]
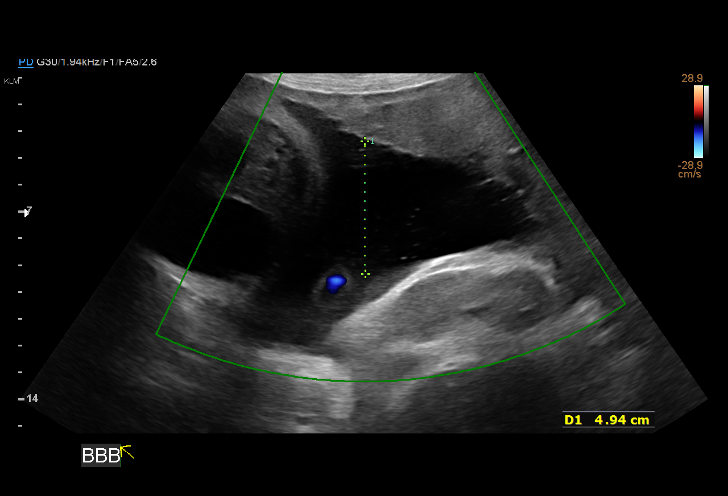
[im 11/15]
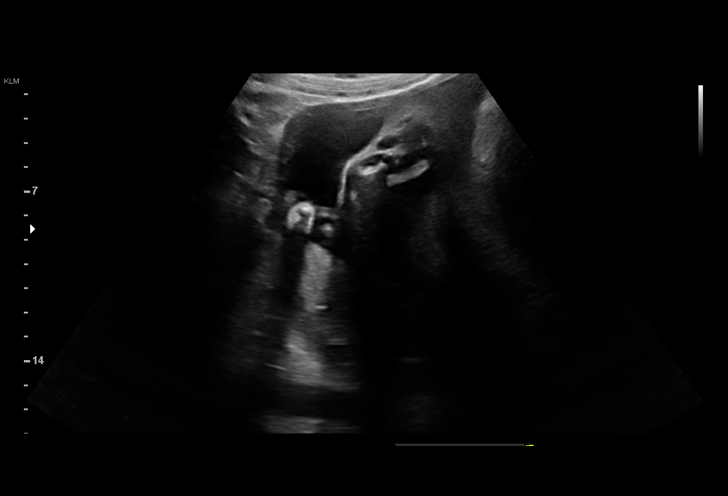
[im 12/15]
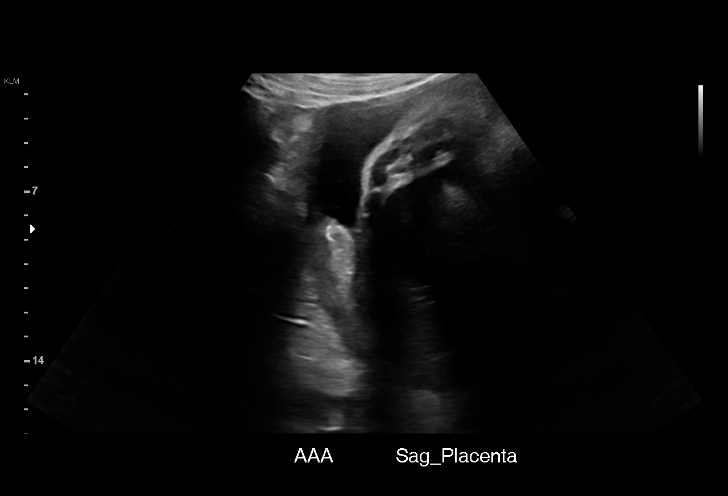
[im 13/15]
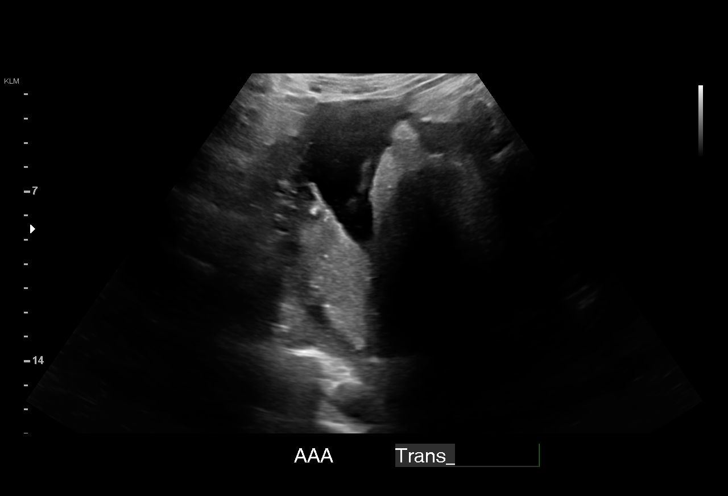
[im 14/15]
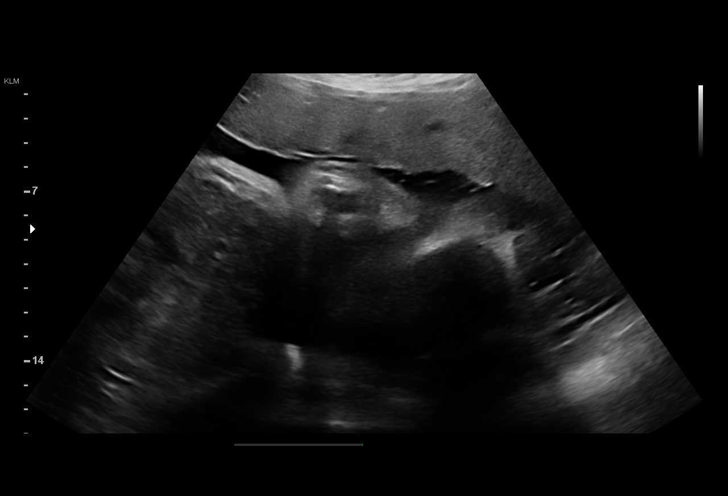
[im 15/15]
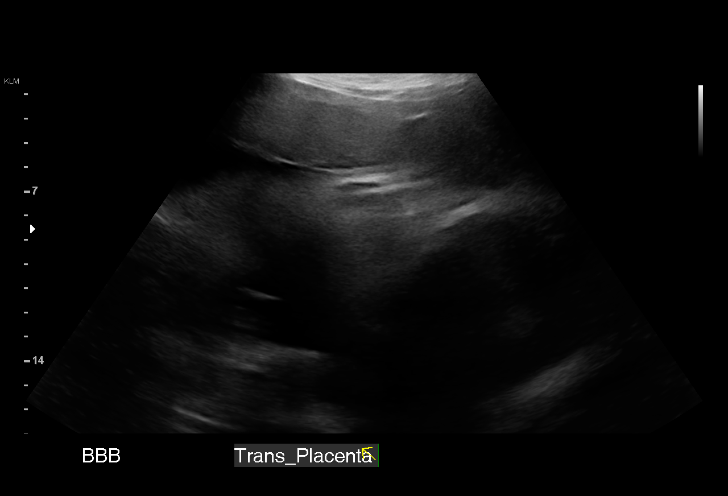

[14 of 15 positions shown; findings below may reference images not displayed]

[HOSPITAL],
                                                            Inc.
                                                            Care

 1  US MFM OB LIMITED                     76815.01    CHYONIA ERISTAVI

Indications

 Twin pregnancy, di/di, third trimester
 Determine Fetal presentation by ultrasound
 34 weeks gestation of pregnancy
Vital Signs

                                                Height:        5'10"
Fetal Evaluation (Fetus A)

 Num Of Fetuses:         2
 Fetal Heart Rate(bpm):  157
 Cardiac Activity:       Observed
 Fetal Lie:              Lower Fetus
 Presentation:           Cephalic
 Placenta:               Posterior
 P. Cord Insertion:      Previously Visualized

 Amniotic Fluid
 AFI FV:      Within normal limits

                             Largest Pocket(cm)

OB History

 Gravidity:    1
Gestational Age (Fetus A)

 Best:          34w 0d     Det. By:  Early Ultrasound         EDD:   02/09/20
                                     (07/20/19)
Anatomy (Fetus A)

 Stomach:               Appears normal, left   Bladder:                Appears normal
                        sided

Fetal Evaluation (Fetus B)

 Num Of Fetuses:         2
 Fetal Heart Rate(bpm):  158
 Cardiac Activity:       Observed
 Fetal Lie:              Upper Fetus
 Presentation:           Cephalic
 Placenta:               Anterior
 P. Cord Insertion:      Previously Visualized
 Membrane Desc:      Dividing Membrane seen - Dichorionic.

 Amniotic Fluid
 AFI FV:      Within normal limits

                             Largest Pocket(cm)
                             5
Gestational Age (Fetus B)

 Best:          34w 0d     Det. By:  Early Ultrasound         EDD:   02/09/20
                                     (07/20/19)
Anatomy (Fetus B)

 Stomach:               Appears normal, left   Bladder:                Appears normal
                        sided
Cervix Uterus Adnexa

 Cervix
 Not visualized (advanced GA >70wks)
Comments

 This patient was admitted due to a dichorionic, diamniotic
 twin gestation with elevated blood pressures.

 A limited ultrasound performed today shows that both twin A
 and twin B are in the vertex/vertex presentations.

 There was normal amniotic fluid noted around both fetuses.

## 2021-03-09 LAB — OB RESULTS CONSOLE HIV ANTIBODY (ROUTINE TESTING): HIV: NONREACTIVE

## 2021-04-04 ENCOUNTER — Ambulatory Visit: Payer: BC Managed Care – PPO

## 2021-04-29 NOTE — L&D Delivery Note (Signed)
DELIVERY NOTE  Pt complete and at +2 station with urge to push. Epidural controlling pain. Pt pushed and delivered a viable female infant in LOA position. Anterior and posterior shoulders spontaneously delivered with next two pushes; body easily followed next. Infant placed on mothers abdomen and bulb suction of mouth and nose performed. Cord was then clamped and cut by FOB. Cord blood obtained, 3VC. Baby had a vigorous spontaneous cry noted. Placenta then delivered at 2355 intact. Fundal massage performed and pitocin per protocol. Fundus firm. The following lacerations were noted: 1st deg. Repaired in routine fashion with 2-0 vicryl, EBL 150cc. Mother and baby stable. Counts correct   Infant time: 2352 Gender: female Placenta time: 2355 Apgars:  9/9 Weight: pending skin-to-skin  Placenta to pathology

## 2021-05-09 ENCOUNTER — Inpatient Hospital Stay (HOSPITAL_COMMUNITY)
Admission: AD | Admit: 2021-05-09 | Discharge: 2021-05-11 | DRG: 807 | Disposition: A | Discharge status: Home / Self Care | Payer: 59 | Attending: Obstetrics and Gynecology | Admitting: Obstetrics and Gynecology

## 2021-05-09 ENCOUNTER — Inpatient Hospital Stay (HOSPITAL_COMMUNITY): Payer: 59 | Admitting: Anesthesiology

## 2021-05-09 ENCOUNTER — Other Ambulatory Visit: Payer: Self-pay

## 2021-05-09 ENCOUNTER — Encounter (HOSPITAL_COMMUNITY): Payer: Self-pay | Admitting: Obstetrics and Gynecology

## 2021-05-09 DIAGNOSIS — Z20822 Contact with and (suspected) exposure to covid-19: Secondary | ICD-10-CM | POA: Diagnosis present

## 2021-05-09 DIAGNOSIS — O24420 Gestational diabetes mellitus in childbirth, diet controlled: Secondary | ICD-10-CM | POA: Diagnosis present

## 2021-05-09 DIAGNOSIS — Z3A36 36 weeks gestation of pregnancy: Secondary | ICD-10-CM

## 2021-05-09 DIAGNOSIS — O4103X0 Oligohydramnios, third trimester, not applicable or unspecified: Secondary | ICD-10-CM | POA: Diagnosis present

## 2021-05-09 LAB — CBC
HCT: 34.3 % — ABNORMAL LOW (ref 36.0–46.0)
Hemoglobin: 11.5 g/dL — ABNORMAL LOW (ref 12.0–15.0)
MCH: 28.6 pg (ref 26.0–34.0)
MCHC: 33.5 g/dL (ref 30.0–36.0)
MCV: 85.3 fL (ref 80.0–100.0)
Platelets: 414 10*3/uL — ABNORMAL HIGH (ref 150–400)
RBC: 4.02 MIL/uL (ref 3.87–5.11)
RDW: 13.5 % (ref 11.5–15.5)
WBC: 12.5 10*3/uL — ABNORMAL HIGH (ref 4.0–10.5)
nRBC: 0 % (ref 0.0–0.2)

## 2021-05-09 LAB — TYPE AND SCREEN
ABO/RH(D): B POS
Antibody Screen: NEGATIVE

## 2021-05-09 LAB — RESP PANEL BY RT-PCR (FLU A&B, COVID) ARPGX2
Influenza A by PCR: NEGATIVE
Influenza B by PCR: NEGATIVE
SARS Coronavirus 2 by RT PCR: NEGATIVE

## 2021-05-09 LAB — GLUCOSE, CAPILLARY
Glucose-Capillary: 158 mg/dL — ABNORMAL HIGH (ref 70–99)
Glucose-Capillary: 87 mg/dL (ref 70–99)

## 2021-05-09 LAB — GROUP B STREP BY PCR: Group B strep by PCR: NEGATIVE

## 2021-05-09 MED ORDER — OXYTOCIN-SODIUM CHLORIDE 30-0.9 UT/500ML-% IV SOLN
1.0000 m[IU]/min | INTRAVENOUS | Status: DC
  Administered 2021-05-09: 2 m[IU]/min via INTRAVENOUS
  Filled 2021-05-09: qty 500

## 2021-05-09 MED ORDER — OXYCODONE-ACETAMINOPHEN 5-325 MG PO TABS: 2.0000 | ORAL_TABLET | ORAL | Status: DC | PRN

## 2021-05-09 MED ORDER — LIDOCAINE HCL (PF) 1 % IJ SOLN
INTRAMUSCULAR | Status: DC | PRN
  Administered 2021-05-09: 11 mL via EPIDURAL

## 2021-05-09 MED ORDER — OXYTOCIN-SODIUM CHLORIDE 30-0.9 UT/500ML-% IV SOLN
2.5000 [IU]/h | INTRAVENOUS | Status: DC
  Administered 2021-05-10: 2.5 [IU]/h via INTRAVENOUS

## 2021-05-09 MED ORDER — SOD CITRATE-CITRIC ACID 500-334 MG/5ML PO SOLN: 30.0000 mL | ORAL | Status: DC | PRN

## 2021-05-09 MED ORDER — SODIUM CHLORIDE 0.9 % IV SOLN
5.0000 10*6.[IU] | Freq: Once | INTRAVENOUS | Status: AC
  Administered 2021-05-09: 5 10*6.[IU] via INTRAVENOUS
  Filled 2021-05-09: qty 5

## 2021-05-09 MED ORDER — PENICILLIN G POT IN DEXTROSE 60000 UNIT/ML IV SOLN: 3.0000 10*6.[IU] | INTRAVENOUS | Status: DC

## 2021-05-09 MED ORDER — ACETAMINOPHEN 325 MG PO TABS: 650.0000 mg | ORAL_TABLET | ORAL | Status: DC | PRN

## 2021-05-09 MED ORDER — DIPHENHYDRAMINE HCL 50 MG/ML IJ SOLN: 12.5000 mg | INTRAMUSCULAR | Status: DC | PRN

## 2021-05-09 MED ORDER — PHENYLEPHRINE 40 MCG/ML (10ML) SYRINGE FOR IV PUSH (FOR BLOOD PRESSURE SUPPORT): 80.0000 ug | PREFILLED_SYRINGE | INTRAVENOUS | Status: DC | PRN

## 2021-05-09 MED ORDER — OXYTOCIN BOLUS FROM INFUSION
333.0000 mL | Freq: Once | INTRAVENOUS | Status: AC
  Administered 2021-05-09: 333 mL via INTRAVENOUS

## 2021-05-09 MED ORDER — EPHEDRINE 5 MG/ML INJ: 10.0000 mg | INTRAVENOUS | Status: DC | PRN

## 2021-05-09 MED ORDER — OXYCODONE-ACETAMINOPHEN 5-325 MG PO TABS: 1.0000 | ORAL_TABLET | ORAL | Status: DC | PRN

## 2021-05-09 MED ORDER — FENTANYL-BUPIVACAINE-NACL 0.5-0.125-0.9 MG/250ML-% EP SOLN
12.0000 mL/h | EPIDURAL | Status: DC | PRN
  Administered 2021-05-09: 12 mL/h via EPIDURAL
  Filled 2021-05-09: qty 250

## 2021-05-09 MED ORDER — LACTATED RINGERS IV SOLN: INTRAVENOUS | Status: DC

## 2021-05-09 MED ORDER — LACTATED RINGERS IV SOLN: 500.0000 mL | INTRAVENOUS | Status: DC | PRN

## 2021-05-09 MED ORDER — TERBUTALINE SULFATE 1 MG/ML IJ SOLN: 0.2500 mg | Freq: Once | INTRAMUSCULAR | Status: DC | PRN

## 2021-05-09 MED ORDER — LIDOCAINE HCL (PF) 1 % IJ SOLN: 30.0000 mL | INTRAMUSCULAR | Status: DC | PRN

## 2021-05-09 MED ORDER — LACTATED RINGERS IV SOLN
500.0000 mL | Freq: Once | INTRAVENOUS | Status: AC
  Administered 2021-05-09: 500 mL via INTRAVENOUS

## 2021-05-09 MED ORDER — ONDANSETRON HCL 4 MG/2ML IJ SOLN: 4.0000 mg | Freq: Four times a day (QID) | INTRAMUSCULAR | Status: DC | PRN

## 2021-05-09 NOTE — H&P (Addendum)
Kellie Ross is a 31 y.o. female presenting for IOL. +FM, denies VB, LOF, notes contractions q6-27m starting last night. GBS unk  PNC c/b: 1) Oligohydramnios: since 34wks, weekly BPP performed. Today's showed BPP 4/8 with -2 for gross FM and fetal tone. AFI 6.4 2) H/o PreE: induced at 35wks w/ spontaneous di-di-TIUP. Baseline labs WNL, normotensive this pregnancy 3) GDM: diet controlled 4) BMI 36   OB History     Gravida  1   Para  1   Term      Preterm  1   AB      Living  2      SAB      IAB      Ectopic      Multiple  1   Live Births  2          Past Medical History:  Diagnosis Date   Arthritis    GERD (gastroesophageal reflux disease)    Headache    Past Surgical History:  Procedure Laterality Date   NO PAST SURGERIES     Family History: family history is not on file. Social History:  reports that she has never smoked. She has never used smokeless tobacco. She reports that she does not currently use alcohol. She reports that she does not use drugs.     Maternal Diabetes: Yes:  Diabetes Type:  Diet controlled Genetic Screening: Declined Maternal Ultrasounds/Referrals: Normal Fetal Ultrasounds or other Referrals:  None Maternal Substance Abuse:  No Significant Maternal Medications:  None Significant Maternal Lab Results:  None Other Comments:  None  Review of Systems  Constitutional:  Negative for chills and fever.  Respiratory:  Negative for shortness of breath.   Cardiovascular:  Negative for chest pain, palpitations and leg swelling.  Gastrointestinal:  Negative for abdominal pain and vomiting.  Neurological:  Negative for dizziness, weakness and headaches.  Psychiatric/Behavioral:  Negative for suicidal ideas.   Maternal Medical History:  Contractions: Onset was yesterday.   Perceived severity is mild.   Fetal activity: Perceived fetal activity is normal.   Prenatal complications: Oligohydramnios.   No PIH, pre-eclampsia or  thrombocytopenia.   Prenatal Complications - Diabetes: gestational. Diabetes is managed by diet.      unknown if currently breastfeeding. Exam Physical Exam Constitutional:      General: She is not in acute distress.    Appearance: She is well-developed.  HENT:     Head: Normocephalic and atraumatic.  Eyes:     Pupils: Pupils are equal, round, and reactive to light.  Cardiovascular:     Rate and Rhythm: Normal rate and regular rhythm.     Heart sounds: No murmur heard.   No gallop.  Abdominal:     Tenderness: There is no abdominal tenderness. There is no guarding or rebound.  Genitourinary:    Vagina: Normal.  Musculoskeletal:        General: Normal range of motion.     Cervical back: Normal range of motion and neck supple.  Skin:    General: Skin is warm and dry.  Neurological:     Mental Status: She is alert and oriented to person, place, and time.    Prenatal labs: ABO, Rh:  Bpos Antibody:  neg Rubella:  imm RPR:   nr HBsAg:   neg HIV:   nr GBS:   UNK  Assessment/Plan: This is a 30yo G1W2993 @ 36 4/7 by LMP c/w 9wk scan admitted for IOL for BPP 4/8 in setting of oligohydramnios.  PNC c/b GDMA1, BMI 36. CE 3/60/-2 in office, plan to initiate pitocin and titrate per protocol. Epidural if desired. Close eye on tracing given BPP in-office. GBS to be drawn on the floor, will start PCN for PPX in interim. AROM when amenable. Pelvis proven to 5lb11oz.  Category 1 tracing, TOCO irritability at this time   Carlisle Cater 05/09/2021, 4:04 PM

## 2021-05-09 NOTE — Anesthesia Preprocedure Evaluation (Signed)
Anesthesia Evaluation  Patient identified by MRN, date of birth, ID band  Reviewed: Allergy & Precautions, NPO status , Patient's Chart, lab work & pertinent test results  Airway Mallampati: II       Dental no notable dental hx.    Pulmonary neg pulmonary ROS,    Pulmonary exam normal breath sounds clear to auscultation       Cardiovascular hypertension, Normal cardiovascular exam Rhythm:Regular Rate:Normal  Preeclampsia on magnesium   Neuro/Psych  Headaches, negative psych ROS   GI/Hepatic GERD  ,  Endo/Other    Renal/GU      Musculoskeletal  (+) Arthritis ,   Abdominal   Peds  Hematology   Anesthesia Other Findings   Reproductive/Obstetrics                            Anesthesia Physical Anesthesia Plan  ASA: III  Anesthesia Plan:    Post-op Pain Management:    Induction:   PONV Risk Score and Plan: 2  Airway Management Planned: Nasal Cannula  Additional Equipment:   Intra-op Plan:   Post-operative Plan:   Informed Consent: I have reviewed the patients History and Physical, chart, labs and discussed the procedure including the risks, benefits and alternatives for the proposed anesthesia with the patient or authorized representative who has indicated his/her understanding and acceptance.       Plan Discussed with:   Anesthesia Plan Comments:         Anesthesia Quick Evaluation  

## 2021-05-09 NOTE — Progress Notes (Signed)
Patient with increased vaginal pressure, complete/+2. Set up for delivery BP (!) 126/54    Pulse 90    Temp 97.8 F (36.6 C) (Oral)    Resp 16    Ht 5\' 11"  (1.803 m)    Wt 113.4 kg    LMP 08/27/2020 (Approximate)    SpO2 99%    BMI 34.87 kg/m

## 2021-05-09 NOTE — Anesthesia Procedure Notes (Signed)
Epidural Patient location during procedure: OB Start time: 05/09/2021 9:27 PM End time: 05/09/2021 9:45 PM  Staffing Anesthesiologist: Lowella Curb, MD Performed: anesthesiologist   Preanesthetic Checklist Completed: patient identified, IV checked, site marked, risks and benefits discussed, surgical consent, monitors and equipment checked, pre-op evaluation and timeout performed  Epidural Patient position: sitting Prep: ChloraPrep Patient monitoring: heart rate, cardiac monitor, continuous pulse ox and blood pressure Approach: midline Location: L2-L3 Injection technique: LOR saline  Needle:  Needle type: Tuohy  Needle gauge: 17 G Needle length: 9 cm Needle insertion depth: 7 cm Catheter type: closed end flexible Catheter size: 20 Guage Catheter at skin depth: 11 cm Test dose: negative  Assessment Events: blood not aspirated, injection not painful, no injection resistance, no paresthesia and negative IV test  Additional Notes Reason for block:procedure for pain

## 2021-05-09 NOTE — Progress Notes (Signed)
Patient feeling some cramping now. GBS has returned negative  BP (!) 118/96 (BP Location: Left Arm)    Pulse 93    Temp 97.6 F (36.4 C) (Oral)    Resp 18    Ht 5\' 11"  (1.803 m)    Wt 113.4 kg    LMP 08/27/2020 (Approximate)    BMI 34.87 kg/m  CE 3/75/-2. Clear AROM @ 1840 Pitocin at 10mU/min, TOCO q4-68m. Category 1 tracing  Continue to titrate per protocol. Will cancel GBS ppx at this time

## 2021-05-10 ENCOUNTER — Encounter (HOSPITAL_COMMUNITY): Payer: Self-pay | Admitting: Obstetrics and Gynecology

## 2021-05-10 LAB — CBC
HCT: 30.6 % — ABNORMAL LOW (ref 36.0–46.0)
Hemoglobin: 9.9 g/dL — ABNORMAL LOW (ref 12.0–15.0)
MCH: 27.7 pg (ref 26.0–34.0)
MCHC: 32.4 g/dL (ref 30.0–36.0)
MCV: 85.5 fL (ref 80.0–100.0)
Platelets: 324 10*3/uL (ref 150–400)
RBC: 3.58 MIL/uL — ABNORMAL LOW (ref 3.87–5.11)
RDW: 13.6 % (ref 11.5–15.5)
WBC: 17.4 10*3/uL — ABNORMAL HIGH (ref 4.0–10.5)
nRBC: 0 % (ref 0.0–0.2)

## 2021-05-10 LAB — RPR: RPR Ser Ql: NONREACTIVE

## 2021-05-10 LAB — GLUCOSE, CAPILLARY: Glucose-Capillary: 93 mg/dL (ref 70–99)

## 2021-05-10 MED ORDER — ACETAMINOPHEN 325 MG PO TABS
650.0000 mg | ORAL_TABLET | ORAL | Status: DC | PRN
  Administered 2021-05-10: 650 mg via ORAL
  Filled 2021-05-10: qty 2

## 2021-05-10 MED ORDER — DIBUCAINE (PERIANAL) 1 % EX OINT: 1.0000 "application " | TOPICAL_OINTMENT | CUTANEOUS | Status: DC | PRN

## 2021-05-10 MED ORDER — ONDANSETRON HCL 4 MG/2ML IJ SOLN: 4.0000 mg | INTRAMUSCULAR | Status: DC | PRN

## 2021-05-10 MED ORDER — WITCH HAZEL-GLYCERIN EX PADS: 1.0000 "application " | MEDICATED_PAD | CUTANEOUS | Status: DC | PRN

## 2021-05-10 MED ORDER — SENNOSIDES-DOCUSATE SODIUM 8.6-50 MG PO TABS: 2.0000 | ORAL_TABLET | Freq: Every day | ORAL | Status: DC

## 2021-05-10 MED ORDER — ONDANSETRON HCL 4 MG PO TABS: 4.0000 mg | ORAL_TABLET | ORAL | Status: DC | PRN

## 2021-05-10 MED ORDER — ZOLPIDEM TARTRATE 5 MG PO TABS: 5.0000 mg | ORAL_TABLET | Freq: Every evening | ORAL | Status: DC | PRN

## 2021-05-10 MED ORDER — BENZOCAINE-MENTHOL 20-0.5 % EX AERO
1.0000 "application " | INHALATION_SPRAY | CUTANEOUS | Status: DC | PRN
  Administered 2021-05-10: 1 via TOPICAL
  Filled 2021-05-10: qty 56

## 2021-05-10 MED ORDER — SIMETHICONE 80 MG PO CHEW: 80.0000 mg | CHEWABLE_TABLET | ORAL | Status: DC | PRN

## 2021-05-10 MED ORDER — DIPHENHYDRAMINE HCL 25 MG PO CAPS: 25.0000 mg | ORAL_CAPSULE | Freq: Four times a day (QID) | ORAL | Status: DC | PRN

## 2021-05-10 MED ORDER — PRENATAL MULTIVITAMIN CH
1.0000 | ORAL_TABLET | Freq: Every day | ORAL | Status: DC
  Administered 2021-05-10: 1 via ORAL
  Filled 2021-05-10: qty 1

## 2021-05-10 MED ORDER — COCONUT OIL OIL: 1.0000 "application " | TOPICAL_OIL | Status: DC | PRN

## 2021-05-10 MED ORDER — IBUPROFEN 600 MG PO TABS
600.0000 mg | ORAL_TABLET | Freq: Four times a day (QID) | ORAL | Status: DC
  Administered 2021-05-10 – 2021-05-11 (×5): 600 mg via ORAL
  Filled 2021-05-10 (×5): qty 1

## 2021-05-10 MED ORDER — TETANUS-DIPHTH-ACELL PERTUSSIS 5-2.5-18.5 LF-MCG/0.5 IM SUSY: 0.5000 mL | PREFILLED_SYRINGE | Freq: Once | INTRAMUSCULAR | Status: DC

## 2021-05-10 NOTE — Anesthesia Postprocedure Evaluation (Signed)
Anesthesia Post Note  Patient: Mazi Rackley  Procedure(s) Performed: AN AD HOC LABOR EPIDURAL     Patient location during evaluation: Mother Baby Anesthesia Type: Epidural Level of consciousness: awake, oriented and awake and alert Pain management: pain level controlled Vital Signs Assessment: post-procedure vital signs reviewed and stable Cardiovascular status: stable Postop Assessment: no headache, adequate PO intake, patient able to bend at knees, able to ambulate, no backache and no apparent nausea or vomiting Anesthetic complications: no   No notable events documented.  Last Vitals:  Vitals:   05/10/21 0259 05/10/21 0725  BP: (!) 111/56 117/75  Pulse: 78 68  Resp: 16 16  Temp: 37.1 C 36.9 C  SpO2: 97% 99%    Last Pain:  Vitals:   05/10/21 0725  TempSrc: Oral  PainSc: 0-No pain   Pain Goal:                   Sophea Rackham

## 2021-05-10 NOTE — Progress Notes (Signed)
Post Partum Day 1 Subjective: Kellie Ross is doing well this morning. No complaints. Pain controlled. Ambulating, voiding, tolerating PO. Minimal lochia. Breastfeeding.   Objective: Patient Vitals for the past 24 hrs:  BP Temp Temp src Pulse Resp SpO2 Height Weight  05/10/21 0725 117/75 98.5 F (36.9 C) Oral 68 16 99 % -- --  05/10/21 0259 (!) 111/56 98.8 F (37.1 C) Oral 78 16 97 % -- --  05/10/21 0158 125/64 98.7 F (37.1 C) Oral 72 17 98 % -- --  05/10/21 0135 127/64 -- -- 89 -- -- -- --  05/10/21 0115 121/64 -- -- 77 16 -- -- --  05/10/21 0100 (!) 117/54 -- -- 74 16 -- -- --  05/10/21 0045 (!) 141/60 98 F (36.7 C) Oral 80 16 -- -- --  05/10/21 0031 129/66 -- -- 70 16 -- -- --  05/10/21 0015 135/68 -- -- 81 16 -- -- --  05/10/21 0000 (!) 146/57 -- -- 72 16 -- -- --  05/09/21 2300 (!) 126/54 -- -- 90 16 -- -- --  05/09/21 2235 -- -- -- -- -- 99 % -- --  05/09/21 2230 120/70 -- -- 99 16 99 % -- --  05/09/21 2227 101/81 -- -- 83 -- -- -- --  05/09/21 2226 101/81 -- -- 83 -- -- -- --  05/09/21 2225 101/81 -- -- 83 -- -- -- --  05/09/21 2223 126/62 -- -- (!) 106 -- -- -- --  05/09/21 2222 126/62 -- -- (!) 106 -- -- -- --  05/09/21 2221 126/62 -- -- (!) 106 -- -- -- --  05/09/21 2218 113/63 -- -- 70 -- -- -- --  05/09/21 2211 129/69 -- -- 67 -- -- -- --  05/09/21 2205 134/79 -- -- 70 16 -- -- --  05/09/21 2201 126/73 -- -- 64 16 -- -- --  05/09/21 2200 -- 97.8 F (36.6 C) Oral -- -- 100 % -- --  05/09/21 2157 133/70 -- -- 66 16 100 % -- --  05/09/21 2151 126/78 -- -- 69 16 -- -- --  05/09/21 2150 -- -- -- -- -- 99 % -- --  05/09/21 2145 98/86 -- -- 63 16 100 % -- --  05/09/21 2140 116/60 -- -- 79 16 -- -- --  05/09/21 2100 122/80 -- -- 76 16 -- -- --  05/09/21 2007 111/70 97.8 F (36.6 C) Oral (!) 104 16 -- -- --  05/09/21 1846 -- 97.6 F (36.4 C) Oral -- -- -- -- --  05/09/21 1632 (!) 118/96 98.1 F (36.7 C) Oral 93 18 -- 5\' 11"  (1.803 m) 113.4 kg    Physical Exam:   General: alert, cooperative, and no distress Lochia: appropriate Uterine Fundus: firm DVT Evaluation: No evidence of DVT seen on physical exam.  Recent Labs    05/09/21 1640 05/10/21 0501  WBC 12.5* 17.4*  HGB 11.5* 9.9*  HCT 34.3* 30.6*  PLT 414* 324    No results for input(s): NA, K, CL, CO2CT, BUN, CREATININE, GLUCOSE, BILITOT, ALT, AST, ALKPHOS, PROT, ALBUMIN in the last 72 hours.  No results for input(s): CALCIUM, MG, PHOS in the last 72 hours.  No results for input(s): PROTIME, APTT, INR in the last 72 hours.  No results for input(s): PROTIME, APTT, INR, FIBRINOGEN in the last 72 hours. Assessment/Plan:  Kellie Ross 31 y.o. [id number] PPD#1 sp SVD 1. PPC: continue routine postpartum care 2. Rh post, rubella immune 3. Dispo: pediatricians planning infant observation 48-72hrs, anticipate  Kellie Ross will be ready for discharge to board on PPD#2   LOS: 1 day   Kellie Ross 05/10/2021, 9:10 AM

## 2021-05-10 NOTE — Lactation Note (Signed)
This note was copied from a baby's chart. Lactation Consultation Note  Patient Name: Kellie Ross JSHFW'Y Date: 05/10/2021   Age:30 hours  Mom is a G2P3 who pumped & nursed for 2.5 mo with her twins (now 24 mo old). Mom reports only being able to express a max of 11 mL per day with her twins and said that they were 99% formula-fed b/c of low milk supply.  Mom mentioned that the spacing of her breasts and shape of her breasts may have been a factor in her low supply. Mom's breasts are wide-spaced and her L breast has the appearance of IGT.   Mom hopes that she makes more milk this time, but also does not feel the need to see lactation unless she has a question.  Mom mentioned that the yellow Similac slow-flow nipple was too fast. I provided the Nfant Standard nipple and asked her to let her nurse know if there wasn't an appreciable difference. I made B. Alona Bene, RN aware.   Mom was made aware of O/P services, breastfeeding support groups, community resources, and our phone # for post-discharge questions.   Lurline Hare [facility] 05/10/2021, 2:04 PM

## 2021-05-11 LAB — SURGICAL PATHOLOGY

## 2021-05-11 MED ORDER — IBUPROFEN 600 MG PO TABS: 600.0000 mg | ORAL_TABLET | Freq: Four times a day (QID) | ORAL | 0 refills | Status: AC

## 2021-05-11 NOTE — Discharge Instructions (Signed)
As per discharge pamphlet °

## 2021-05-11 NOTE — Progress Notes (Signed)
PPD #2 Doing well, baby stable Afeb, VSS Fundus firm D/c home

## 2021-05-11 NOTE — Discharge Summary (Signed)
Postpartum Discharge Summary      Patient Name: Kellie Ross DOB: [birth year] MRN: [id number]  Date of admission: 05/09/2021 Delivery date:05/09/2021  Delivering provider: Carlisle Cater  Date of discharge: 05/11/2021  Admitting diagnosis: [redacted] weeks gestation of pregnancy [Z3A.36] Intrauterine pregnancy: [redacted]w[redacted]d     Secondary diagnosis:  Principal Problem:   [redacted] weeks gestation of pregnancy    Discharge diagnosis: Preterm Pregnancy Delivered, GDM A1, and oligohydramnios with 4/8 [facility]                                                Hospital course: Induction of Labor With Vaginal Delivery   31 y.o. yo G2P0203 at 101w3d was admitted to the hospital 05/09/2021 for induction of labor.  Indication for induction:  oligohydramnios with 4/8 BPP .  Patient had an uncomplicated labor course as follows: Membrane Rupture Time/Date: 6:45 PM ,05/09/2021   Delivery Method:Vaginal, Spontaneous  Episiotomy: None  Lacerations:  1st degree  Details of delivery can be found in separate delivery note.  Patient had a routine postpartum course. Patient is discharged home 05/11/21.  Newborn Data: Birth date:05/09/2021  Birth time:11:52 PM  Gender:Female  Living status:Living  Apgars:8 ,9  Weight:3320 g   Physical exam  Vitals:   05/10/21 1110 05/10/21 1440 05/10/21 2042 05/11/21 0516  BP: (!) 107/56 115/68 124/67 (!) 119/59  Pulse: 67 60 65 71  Resp: 16 16 16 16   Temp: 97.8 F (36.6 C) 98 F (36.7 C) (!) 97.4 F (36.3 C) 98.1 F (36.7 C)  TempSrc: Axillary Axillary Oral Oral  SpO2: 98% 99% 97% 98%  Weight:      Height:       General: alert Lochia: appropriate Uterine Fundus: firm  Labs: Lab Results  Component Value Date   WBC 17.4 (H) 05/10/2021   HGB 9.9 (L) 05/10/2021   HCT 30.6 (L) 05/10/2021   MCV 85.5 05/10/2021   PLT 324 05/10/2021   CMP Latest Ref Rng & Units 01/04/2020  Glucose 70 - 99 mg/dL 82  BUN 6 - 20 mg/dL 11  Creatinine 03/05/2020 - 6.50 mg/dL 3.54  Sodium 6.56 - 812  mmol/L 132(L)  Potassium 3.5 - 5.1 mmol/L 4.1  Chloride 98 - 111 mmol/L 102  CO2 22 - 32 mmol/L 21(L)  Calcium 8.9 - 10.3 mg/dL 751)  Total Protein 6.5 - 8.1 g/dL 5.3(L)  Total Bilirubin 0.3 - 1.2 mg/dL 0.7  Alkaline Phos 38 - 126 U/L 187(H)  AST 15 - 41 U/L 46(H)  ALT 0 - 44 U/L 20   Edinburgh Score: Edinburgh Postnatal Depression Scale Screening Tool 05/10/2021  I have been able to laugh and see the funny side of things. 0  I have looked forward with enjoyment to things. 0  I have blamed myself unnecessarily when things went wrong. 0  I have been anxious or worried for no good reason. 0  I have felt scared or panicky for no good reason. 0  Things have been getting on top of me. 0  I have been so unhappy that I have had difficulty sleeping. 0  I have felt sad or miserable. 0  I have been so unhappy that I have been crying. 0  The thought of harming myself has occurred to me. 0  Edinburgh Postnatal Depression Scale Total 0      After  visit meds:  Allergies as of 05/11/2021   No Known Allergies      Medication List     STOP taking these medications    NIFEdipine 60 MG 24 hr tablet Commonly known as: ADALAT CC       TAKE these medications    FeroSul 325 (65 FE) MG tablet Generic drug: ferrous sulfate Take 325 mg by mouth 2 (two) times daily.   ibuprofen 600 MG tablet Commonly known as: ADVIL Take 1 tablet (600 mg total) by mouth every 6 (six) hours.   Stool Softener 100 MG capsule Generic drug: docusate sodium Take 100 mg by mouth 2 (two) times daily.         Discharge home in stable condition Infant Feeding: Breast Infant Disposition:home with mother Discharge instruction: per After Visit Summary and Postpartum booklet. Activity: Advance as tolerated. Pelvic rest for 6 weeks.  Diet: routine diet Postpartum Appointment:6 weeks Follow up Visit:  Follow-up Information     [name], Kellie Ross. Schedule an appointment as soon as possible for a  visit in 6 week(s).   Specialty: Obstetrics and Gynecology Contact information: [address] [place] [address] [place] [address] [phone]                     05/11/2021 Kellie Ross

## 2021-05-24 ENCOUNTER — Telehealth (HOSPITAL_COMMUNITY): Payer: Self-pay | Admitting: *Deleted

## 2021-05-24 NOTE — Telephone Encounter (Signed)
Hospital Discharge Follow-Up Call:  Patient reports that she is well and has no concerns about her healing process.  EPDS today was 1 and she endorses this accurately reflects that she is doing well emotionally.  She declines PP group information.  Patient says that baby is well and she has no concerns about baby's health.  She reports that baby sleeps in a bassinet beside her bed.  ABCs of Safe Sleep reviewed.
